# Patient Record
Sex: Male | Born: 1935 | Race: White | Hispanic: No | Smoking: Former smoker
Health system: Southern US, Community
[De-identification: ages and names within clinical notes are randomized; demographics above are authoritative.]

## PROBLEM LIST (undated history)

## (undated) DIAGNOSIS — E785 Hyperlipidemia, unspecified: Secondary | ICD-10-CM

## (undated) DIAGNOSIS — A809 Acute poliomyelitis, unspecified: Secondary | ICD-10-CM

## (undated) DIAGNOSIS — I4891 Unspecified atrial fibrillation: Secondary | ICD-10-CM

## (undated) DIAGNOSIS — I2699 Other pulmonary embolism without acute cor pulmonale: Secondary | ICD-10-CM

## (undated) DIAGNOSIS — E119 Type 2 diabetes mellitus without complications: Secondary | ICD-10-CM

## (undated) DIAGNOSIS — I10 Essential (primary) hypertension: Secondary | ICD-10-CM

## (undated) DIAGNOSIS — C61 Malignant neoplasm of prostate: Secondary | ICD-10-CM

## (undated) DIAGNOSIS — I5030 Unspecified diastolic (congestive) heart failure: Secondary | ICD-10-CM

## (undated) HISTORY — PX: HIP FRACTURE SURGERY: SHX118

---

## 2004-12-12 DIAGNOSIS — I2699 Other pulmonary embolism without acute cor pulmonale: Secondary | ICD-10-CM

## 2004-12-12 HISTORY — DX: Other pulmonary embolism without acute cor pulmonale: I26.99

## 2005-02-17 ENCOUNTER — Ambulatory Visit (HOSPITAL_COMMUNITY): Admission: RE | Admit: 2005-02-17 | Discharge: 2005-02-17 | Payer: Self-pay | Admitting: Urology

## 2005-03-15 ENCOUNTER — Ambulatory Visit: Payer: Self-pay | Admitting: Internal Medicine

## 2005-09-20 ENCOUNTER — Ambulatory Visit: Admission: RE | Admit: 2005-09-20 | Discharge: 2005-12-02 | Payer: Self-pay | Admitting: *Deleted

## 2013-08-09 ENCOUNTER — Encounter: Payer: Self-pay | Admitting: Cardiology

## 2013-08-11 DIAGNOSIS — I509 Heart failure, unspecified: Secondary | ICD-10-CM

## 2013-08-13 ENCOUNTER — Encounter: Payer: Self-pay | Admitting: Cardiology

## 2013-08-13 DIAGNOSIS — I509 Heart failure, unspecified: Secondary | ICD-10-CM

## 2013-08-13 DIAGNOSIS — I4891 Unspecified atrial fibrillation: Secondary | ICD-10-CM

## 2013-09-03 ENCOUNTER — Encounter: Payer: Self-pay | Admitting: Cardiology

## 2013-09-03 DIAGNOSIS — R9430 Abnormal result of cardiovascular function study, unspecified: Secondary | ICD-10-CM | POA: Insufficient documentation

## 2013-12-12 ENCOUNTER — Encounter: Payer: Self-pay | Admitting: Cardiology

## 2013-12-13 ENCOUNTER — Encounter (HOSPITAL_COMMUNITY): Payer: Self-pay | Admitting: Pulmonary Disease

## 2013-12-13 ENCOUNTER — Inpatient Hospital Stay (HOSPITAL_COMMUNITY)
Admission: AD | Admit: 2013-12-13 | Discharge: 2013-12-20 | DRG: 871 | Disposition: A | Discharge status: Skilled Nursing Facility | Payer: Medicare Other | Source: Other Acute Inpatient Hospital | Attending: Critical Care Medicine | Admitting: Critical Care Medicine

## 2013-12-13 ENCOUNTER — Inpatient Hospital Stay (HOSPITAL_COMMUNITY): Payer: Medicare Other

## 2013-12-13 DIAGNOSIS — A419 Sepsis, unspecified organism: Principal | ICD-10-CM | POA: Diagnosis present

## 2013-12-13 DIAGNOSIS — E872 Acidosis, unspecified: Secondary | ICD-10-CM | POA: Diagnosis present

## 2013-12-13 DIAGNOSIS — E876 Hypokalemia: Secondary | ICD-10-CM | POA: Diagnosis present

## 2013-12-13 DIAGNOSIS — J151 Pneumonia due to Pseudomonas: Secondary | ICD-10-CM | POA: Diagnosis present

## 2013-12-13 DIAGNOSIS — F0390 Unspecified dementia without behavioral disturbance: Secondary | ICD-10-CM | POA: Diagnosis present

## 2013-12-13 DIAGNOSIS — Z8612 Personal history of poliomyelitis: Secondary | ICD-10-CM

## 2013-12-13 DIAGNOSIS — E1169 Type 2 diabetes mellitus with other specified complication: Secondary | ICD-10-CM | POA: Diagnosis present

## 2013-12-13 DIAGNOSIS — I509 Heart failure, unspecified: Secondary | ICD-10-CM | POA: Diagnosis present

## 2013-12-13 DIAGNOSIS — Z794 Long term (current) use of insulin: Secondary | ICD-10-CM

## 2013-12-13 DIAGNOSIS — R6520 Severe sepsis without septic shock: Secondary | ICD-10-CM

## 2013-12-13 DIAGNOSIS — D649 Anemia, unspecified: Secondary | ICD-10-CM | POA: Diagnosis present

## 2013-12-13 DIAGNOSIS — J9600 Acute respiratory failure, unspecified whether with hypoxia or hypercapnia: Secondary | ICD-10-CM | POA: Diagnosis present

## 2013-12-13 DIAGNOSIS — J90 Pleural effusion, not elsewhere classified: Secondary | ICD-10-CM | POA: Diagnosis present

## 2013-12-13 DIAGNOSIS — E8779 Other fluid overload: Secondary | ICD-10-CM | POA: Diagnosis present

## 2013-12-13 DIAGNOSIS — Z8546 Personal history of malignant neoplasm of prostate: Secondary | ICD-10-CM

## 2013-12-13 DIAGNOSIS — E875 Hyperkalemia: Secondary | ICD-10-CM | POA: Diagnosis present

## 2013-12-13 DIAGNOSIS — E43 Unspecified severe protein-calorie malnutrition: Secondary | ICD-10-CM | POA: Diagnosis present

## 2013-12-13 DIAGNOSIS — R6521 Severe sepsis with septic shock: Secondary | ICD-10-CM

## 2013-12-13 DIAGNOSIS — J189 Pneumonia, unspecified organism: Secondary | ICD-10-CM

## 2013-12-13 DIAGNOSIS — I10 Essential (primary) hypertension: Secondary | ICD-10-CM | POA: Diagnosis present

## 2013-12-13 DIAGNOSIS — I4891 Unspecified atrial fibrillation: Secondary | ICD-10-CM | POA: Diagnosis present

## 2013-12-13 DIAGNOSIS — Z79899 Other long term (current) drug therapy: Secondary | ICD-10-CM

## 2013-12-13 DIAGNOSIS — E870 Hyperosmolality and hypernatremia: Secondary | ICD-10-CM | POA: Diagnosis present

## 2013-12-13 DIAGNOSIS — G9340 Encephalopathy, unspecified: Secondary | ICD-10-CM | POA: Diagnosis present

## 2013-12-13 DIAGNOSIS — N179 Acute kidney failure, unspecified: Secondary | ICD-10-CM | POA: Diagnosis present

## 2013-12-13 DIAGNOSIS — R791 Abnormal coagulation profile: Secondary | ICD-10-CM | POA: Diagnosis present

## 2013-12-13 DIAGNOSIS — E785 Hyperlipidemia, unspecified: Secondary | ICD-10-CM | POA: Diagnosis present

## 2013-12-13 DIAGNOSIS — Z7901 Long term (current) use of anticoagulants: Secondary | ICD-10-CM

## 2013-12-13 DIAGNOSIS — R410 Disorientation, unspecified: Secondary | ICD-10-CM | POA: Diagnosis not present

## 2013-12-13 DIAGNOSIS — Z86711 Personal history of pulmonary embolism: Secondary | ICD-10-CM

## 2013-12-13 DIAGNOSIS — R9430 Abnormal result of cardiovascular function study, unspecified: Secondary | ICD-10-CM

## 2013-12-13 DIAGNOSIS — I5032 Chronic diastolic (congestive) heart failure: Secondary | ICD-10-CM | POA: Diagnosis present

## 2013-12-13 DIAGNOSIS — Z87891 Personal history of nicotine dependence: Secondary | ICD-10-CM

## 2013-12-13 HISTORY — DX: Malignant neoplasm of prostate: C61

## 2013-12-13 HISTORY — DX: Unspecified diastolic (congestive) heart failure: I50.30

## 2013-12-13 HISTORY — DX: Hyperlipidemia, unspecified: E78.5

## 2013-12-13 HISTORY — DX: Type 2 diabetes mellitus without complications: E11.9

## 2013-12-13 HISTORY — DX: Other pulmonary embolism without acute cor pulmonale: I26.99

## 2013-12-13 HISTORY — DX: Acute poliomyelitis, unspecified: A80.9

## 2013-12-13 HISTORY — DX: Unspecified atrial fibrillation: I48.91

## 2013-12-13 HISTORY — DX: Essential (primary) hypertension: I10

## 2013-12-13 LAB — TROPONIN I
Troponin I: <0.3 ng/mL (ref <0.30)
Troponin I: <0.3 ng/mL (ref <0.30)

## 2013-12-13 LAB — CBC WITH DIFFERENTIAL/PLATELET
Basophils Absolute: 0 10*3/uL (ref 0.0–0.1)
Basophils Relative: 0 % (ref 0–1)
EOS PCT: 0 % (ref 0–5)
Eosinophils Absolute: 0 10*3/uL (ref 0.0–0.7)
HEMATOCRIT: 36.3 % — AB (ref 39.0–52.0)
Hemoglobin: 11.1 g/dL — ABNORMAL LOW (ref 13.0–17.0)
LYMPHS ABS: 0.8 10*3/uL (ref 0.7–4.0)
Lymphocytes Relative: 3 % — ABNORMAL LOW (ref 12–46)
MCH: 26.7 pg (ref 26.0–34.0)
MCHC: 30.6 g/dL (ref 30.0–36.0)
MCV: 87.5 fL (ref 78.0–100.0)
MONO ABS: 1.8 10*3/uL — AB (ref 0.1–1.0)
Monocytes Relative: 7 % (ref 3–12)
NEUTROS ABS: 23.4 10*3/uL — AB (ref 1.7–7.7)
Neutrophils Relative %: 90 % — ABNORMAL HIGH (ref 43–77)
PLATELETS: 548 10*3/uL — AB (ref 150–400)
RBC: 4.15 MIL/uL — ABNORMAL LOW (ref 4.22–5.81)
RDW: 22 % — ABNORMAL HIGH (ref 11.5–15.5)
WBC: 26 10*3/uL — ABNORMAL HIGH (ref 4.0–10.5)

## 2013-12-13 LAB — COMPREHENSIVE METABOLIC PANEL
ALT: 11 U/L (ref 0–53)
AST: 24 U/L (ref 0–37)
Albumin: 1.7 g/dL — ABNORMAL LOW (ref 3.5–5.2)
Alkaline Phosphatase: 154 U/L — ABNORMAL HIGH (ref 39–117)
BUN: 32 mg/dL — ABNORMAL HIGH (ref 6–23)
CALCIUM: 6.6 mg/dL — AB (ref 8.4–10.5)
CO2: 15 mEq/L — ABNORMAL LOW (ref 19–32)
Chloride: 117 mEq/L — ABNORMAL HIGH (ref 96–112)
Creatinine, Ser: 2.18 mg/dL — ABNORMAL HIGH (ref 0.50–1.35)
GFR calc non Af Amer: 27 mL/min — ABNORMAL LOW (ref >90)
GFR, EST AFRICAN AMERICAN: 31 mL/min — AB (ref >90)
Glucose, Bld: 87 mg/dL (ref 70–99)
Potassium: 3.3 mEq/L — ABNORMAL LOW (ref 3.7–5.3)
SODIUM: 150 meq/L — AB (ref 137–147)
Total Bilirubin: 0.4 mg/dL (ref 0.3–1.2)
Total Protein: 5.7 g/dL — ABNORMAL LOW (ref 6.0–8.3)

## 2013-12-13 LAB — APTT: aPTT: 50 seconds — ABNORMAL HIGH (ref 24–37)

## 2013-12-13 LAB — CORTISOL: Cortisol, Plasma: 24.3 ug/dL

## 2013-12-13 LAB — MRSA PCR SCREENING: MRSA by PCR: POSITIVE — AB

## 2013-12-13 LAB — PROTIME-INR
INR: 3.24 — ABNORMAL HIGH (ref 0.00–1.49)
Prothrombin Time: 31.9 seconds — ABNORMAL HIGH (ref 11.6–15.2)

## 2013-12-13 LAB — PHOSPHORUS: Phosphorus: 4.5 mg/dL (ref 2.3–4.6)

## 2013-12-13 LAB — GLUCOSE, CAPILLARY
GLUCOSE-CAPILLARY: 96 mg/dL (ref 70–99)
GLUCOSE-CAPILLARY: 70 mg/dL (ref 70–99)
Glucose-Capillary: 103 mg/dL — ABNORMAL HIGH (ref 70–99)
Glucose-Capillary: 90 mg/dL (ref 70–99)

## 2013-12-13 LAB — STREP PNEUMONIAE URINARY ANTIGEN: STREP PNEUMO URINARY ANTIGEN: NEGATIVE

## 2013-12-13 LAB — PROCALCITONIN: PROCALCITONIN: 0.39 ng/mL

## 2013-12-13 LAB — INFLUENZA PANEL BY PCR (TYPE A & B)
H1N1 flu by pcr: NOT DETECTED
Influenza A By PCR: NEGATIVE
Influenza B By PCR: NEGATIVE

## 2013-12-13 LAB — LACTIC ACID, PLASMA: LACTIC ACID, VENOUS: 2.2 mmol/L (ref 0.5–2.2)

## 2013-12-13 LAB — PRO B NATRIURETIC PEPTIDE: Pro B Natriuretic peptide (BNP): 7829 pg/mL — ABNORMAL HIGH (ref 0–450)

## 2013-12-13 LAB — MAGNESIUM: Magnesium: 1.9 mg/dL (ref 1.5–2.5)

## 2013-12-13 MED ORDER — SODIUM CHLORIDE 0.9 % IV SOLN
250.0000 mL | INTRAVENOUS | Status: DC | PRN
  Administered 2013-12-14: via INTRAVENOUS

## 2013-12-13 MED ORDER — BIOTENE DRY MOUTH MT LIQD
15.0000 mL | Freq: Four times a day (QID) | OROMUCOSAL | Status: DC
  Administered 2013-12-13 – 2013-12-14 (×5): 15 mL via OROMUCOSAL

## 2013-12-13 MED ORDER — LEVOFLOXACIN IN D5W 750 MG/150ML IV SOLN
750.0000 mg | Freq: Once | INTRAVENOUS | Status: AC
  Administered 2013-12-13: 750 mg via INTRAVENOUS
  Filled 2013-12-13: qty 150

## 2013-12-13 MED ORDER — VANCOMYCIN HCL 10 G IV SOLR
1250.0000 mg | Freq: Once | INTRAVENOUS | Status: AC
  Administered 2013-12-13: 1250 mg via INTRAVENOUS
  Filled 2013-12-13: qty 1250

## 2013-12-13 MED ORDER — METOPROLOL TARTRATE 1 MG/ML IV SOLN
5.0000 mg | Freq: Once | INTRAVENOUS | Status: AC
  Administered 2013-12-13: 5 mg via INTRAVENOUS

## 2013-12-13 MED ORDER — CHLORHEXIDINE GLUCONATE 0.12 % MT SOLN
15.0000 mL | Freq: Two times a day (BID) | OROMUCOSAL | Status: DC
  Administered 2013-12-13 – 2013-12-14 (×2): 15 mL via OROMUCOSAL
  Filled 2013-12-13 (×2): qty 15

## 2013-12-13 MED ORDER — LEVOFLOXACIN IN D5W 500 MG/100ML IV SOLN
500.0000 mg | INTRAVENOUS | Status: DC
  Administered 2013-12-15: 500 mg via INTRAVENOUS
  Filled 2013-12-13: qty 100

## 2013-12-13 MED ORDER — SODIUM CHLORIDE 0.9 % IJ SOLN: 3.0000 mL | INTRAMUSCULAR | Status: DC | PRN

## 2013-12-13 MED ORDER — SODIUM CHLORIDE 0.9 % IJ SOLN
3.0000 mL | Freq: Two times a day (BID) | INTRAMUSCULAR | Status: DC
  Administered 2013-12-13 – 2013-12-14 (×2): 3 mL via INTRAVENOUS

## 2013-12-13 MED ORDER — OSELTAMIVIR PHOSPHATE 6 MG/ML PO SUSR
30.0000 mg | Freq: Every day | ORAL | Status: DC
  Administered 2013-12-13: 30 mg
  Filled 2013-12-13 (×2): qty 5

## 2013-12-13 MED ORDER — PANTOPRAZOLE SODIUM 40 MG IV SOLR
40.0000 mg | Freq: Every day | INTRAVENOUS | Status: DC
  Administered 2013-12-13 – 2013-12-15 (×3): 40 mg via INTRAVENOUS
  Filled 2013-12-13 (×4): qty 40

## 2013-12-13 MED ORDER — INSULIN ASPART 100 UNIT/ML ~~LOC~~ SOLN
0.0000 [IU] | SUBCUTANEOUS | Status: DC
  Administered 2013-12-14: 3 [IU] via SUBCUTANEOUS
  Administered 2013-12-15: 1 [IU] via SUBCUTANEOUS

## 2013-12-13 MED ORDER — DEXTROSE 5 % IV SOLN
500.0000 mg | Freq: Three times a day (TID) | INTRAVENOUS | Status: DC
  Administered 2013-12-13 – 2013-12-16 (×10): 500 mg via INTRAVENOUS
  Filled 2013-12-13 (×12): qty 0.5

## 2013-12-13 MED ORDER — FENTANYL CITRATE 0.05 MG/ML IJ SOLN
50.0000 ug | INTRAMUSCULAR | Status: DC | PRN
  Administered 2013-12-13 – 2013-12-14 (×2): 50 ug via INTRAVENOUS
  Filled 2013-12-13 (×2): qty 2

## 2013-12-13 MED ORDER — SODIUM CHLORIDE 0.9 % IV BOLUS (SEPSIS)
250.0000 mL | Freq: Once | INTRAVENOUS | Status: AC
  Administered 2013-12-13: 250 mL via INTRAVENOUS

## 2013-12-13 MED ORDER — FENTANYL CITRATE 0.05 MG/ML IJ SOLN
INTRAMUSCULAR | Status: AC
  Filled 2013-12-13: qty 2

## 2013-12-13 MED ORDER — SODIUM CHLORIDE 0.9 % IV SOLN: 250.0000 mL | INTRAVENOUS | Status: DC | PRN

## 2013-12-13 MED ORDER — VITAL AF 1.2 CAL PO LIQD
1000.0000 mL | ORAL | Status: DC
  Administered 2013-12-13: 1000 mL
  Filled 2013-12-13 (×5): qty 1000

## 2013-12-13 MED ORDER — OSELTAMIVIR PHOSPHATE 6 MG/ML PO SUSR
75.0000 mg | Freq: Two times a day (BID) | ORAL | Status: DC
  Filled 2013-12-13 (×2): qty 12.5

## 2013-12-13 MED ORDER — METOPROLOL TARTRATE 1 MG/ML IV SOLN
INTRAVENOUS | Status: AC
  Filled 2013-12-13: qty 5

## 2013-12-13 MED ORDER — METOPROLOL TARTRATE 1 MG/ML IV SOLN: 5.0000 mg | INTRAVENOUS | Status: DC | PRN

## 2013-12-13 MED ORDER — METOPROLOL TARTRATE 1 MG/ML IV SOLN
2.5000 mg | INTRAVENOUS | Status: DC | PRN
  Administered 2013-12-15: 2.5 mg via INTRAVENOUS
  Filled 2013-12-13: qty 5

## 2013-12-13 MED ORDER — VANCOMYCIN HCL IN DEXTROSE 1-5 GM/200ML-% IV SOLN
1000.0000 mg | INTRAVENOUS | Status: DC
  Filled 2013-12-13: qty 200

## 2013-12-13 MED ORDER — HEPARIN SODIUM (PORCINE) 5000 UNIT/ML IJ SOLN
5000.0000 [IU] | Freq: Three times a day (TID) | INTRAMUSCULAR | Status: DC
  Filled 2013-12-13 (×4): qty 1

## 2013-12-13 MED ORDER — INSULIN ASPART 100 UNIT/ML ~~LOC~~ SOLN: 2.0000 [IU] | SUBCUTANEOUS | Status: DC

## 2013-12-13 MED ORDER — SODIUM CHLORIDE 0.9 % IV BOLUS (SEPSIS)
500.0000 mL | Freq: Once | INTRAVENOUS | Status: AC
  Administered 2013-12-13: 500 mL via INTRAVENOUS

## 2013-12-13 NOTE — Progress Notes (Signed)
Clinical Social Work Department BRIEF PSYCHOSOCIAL ASSESSMENT 12/13/2013  Patient:  Francis Dodson, Francis Dodson     Account Number:  [id number]     Admit date:  12/13/2013  Clinical Social Worker:  Freeman Caldron  Date/Time:  12/13/2013 11:21 AM  Referred by:  Physician  Date Referred:  12/13/2013 Referred for  SNF Placement   Other Referral:   Interview type:  Other - See comment Other interview type:   RN informed CSW pt is from [facility]. Facility confirmed.    PSYCHOSOCIAL DATA Living Status:  FACILITY Admitted from facility:  [facility] Level of care:  [place] Primary support name:  Lequan Dobratz ([phone]) Primary support relationship to patient:  CHILD, ADULT Degree of support available:   Good--pt has lived at [facility] since August 14, 2013 and can return if medically stable to do so.    CURRENT CONCERNS Current Concerns  Post-Acute Placement   Other Concerns:    SOCIAL WORK ASSESSMENT / PLAN Pt intubated and CSW unable to reach pt's son. Left voicemail explaining that according to pt chart he is from [facility] and [place] has verified this with the facility and pt can return. CSW invited son to call back if he would not prefer pt return to this facility. Pt currently on ventilator and might need vent/trach SNF if unable to wean. CSW continues to follow case.   Assessment/plan status:  Psychosocial Support/Ongoing Assessment of Needs Other assessment/ plan:   Information/referral to community resources:   SNF (from [facility], might need vent SNF/trach SNF depending on level of care needs.    PATIENT'S/FAMILY'S RESPONSE TO PLAN OF CARE: N/A       Ky Barban, MSW, [facility] Clinical Social Worker [phone]

## 2013-12-13 NOTE — H&P (Signed)
Name: Francis Dodson MRN: [id number] DOB: 1930/11/13    ADMISSION DATE:  12/13/2013  REFERRING MD :  Lovie Macadamia  CHIEF COMPLAINT:  Short of breath  BRIEF PATIENT DESCRIPTION:  80 yo male from SNF went to [facility] ER with difficulty breathing, and found to have PNA.  Intubated, and transferred to [facility] for further treatment.  SIGNIFICANT EVENTS: 1/02 Transfer to [facility] from [facility] / TUBES: ETT 1/02 >> Lt IJ CVL 1/02 >>  CULTURES: Blood 1/02 ([facility]) >> Sputum 1/02 >> Influenza PCR 1/02 >> Pneumococcal Ag 1/02 >> Legionella Ag 1/02 >>  ANTIBIOTICS: Vancomycin 1/02 >> Aztreonam 1/02 >> Levaquin 1/02 >> Tamiflu 1/02 >>  HISTORY OF PRESENT ILLNESS:   Hx from review of medical record.  80 yo male from SNF went to [facility] ER with difficulty breathing, and found to have PNA.  Intubated, and transferred to [facility] for further treatment.  Noted to have decreased appetite, loose stools, and fatigue in SNF.  PAST MEDICAL HISTORY :  Past Medical History  Diagnosis Date  . Atrial fibrillation   . Diabetes mellitus   . Pulmonary embolism   . Hypertension   . Prostate cancer   . 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 Hyperlipidemia   . Diastolic heart failure    Past Surgical History  Procedure Laterality Date  . Hip fracture surgery     Prior to Admission medications   Not on File   Allergies  Allergen Reactions  . Aspirin   . Nabumetone   . Penicillins   . Piroxicam   . Salicylates     FAMILY HISTORY:  Family History  Problem Relation Age of Onset  . CAD Mother   . Pancreatic cancer Brother   . CAD Sister    SOCIAL HISTORY:  has no tobacco, alcohol, and drug history on file.  REVIEW OF SYSTEMS:   Unable to obtain  SUBJECTIVE:   VITAL SIGNS: Temp:  [96.4 F (35.8 C)] 96.4 F (35.8 C) (01/02 0640) Pulse Rate:  [133] 133 (01/02 0640) BP: (152)/(94) 152/94 mmHg (01/02 0640) SpO2:  [100 %] 100 % (01/02 0640) FiO2 (%):  [60 %] 60  % (01/02 0640) Weight:  [130 lb 8.2 oz (59.2 kg)] 130 lb 8.2 oz (59.2 kg) (01/02 0640) HEMODYNAMICS:   VENTILATOR SETTINGS: Vent Mode:  [-] PRVC FiO2 (%):  [60 %] 60 % Set Rate:  [16 bmp] 16 bmp Vt Set:  [500 mL] 500 mL PEEP:  [5 cmH20] 5 cmH20 Plateau Pressure:  [15 cmH20] 15 cmH20 INTAKE / OUTPUT: Intake/Output   None     PHYSICAL EXAMINATION: General:  Ill appearing Neuro:  Unresponsive HEENT:  Pupils reactive, ETT in place Cardiovascular:  irregular Lungs:  Scattered rhonchi, basilar rales Lt > Rt  Abdomen:  Soft, non tender, decreased bowel sounds Musculoskeletal:  No edema Skin:  No rashes  LABS:  CBC No results found for this basename: WBC, HGB, HCT, PLT,  in the last 168 hours Coag's No results found for this basename: APTT, INR,  in the last 168 hours BMET No results found for this basename: NA, K, CL, CO2, BUN, CREATININE, GLUCOSE,  in the last 168 hours Electrolytes No results found for this basename: CALCIUM, MG, PHOS,  in the last 168 hours Sepsis Markers No results found for this basename: LATICACIDVEN, PROCALCITON, O2SATVEN,  in the last 168 hours ABG No results found for this basename: PHART, PCO2ART, PO2ART,  in the last 168 hours Liver Enzymes No results found for this basename: AST, ALT, ALKPHOS, BILITOT, ALBUMIN,  in the last 168 hours Cardiac Enzymes No results found for this basename: TROPONINI, PROBNP,  in the last 168 hours Glucose No results found for this basename: GLUCAP,  in the last 168 hours  Imaging No results found.   Labs, xrays reviewed from [place].  ASSESSMENT / PLAN:  PULMONARY A: Acute respiratory failure 2nd to PNA. P:   -full vent support -f/u CXR, ABG  CARDIOVASCULAR A:  Hypotension, Sepsis. Hx of A fib, reucrrent PE on chronic coumadin. Hx of HTN, hyperlipidemia, chronic diastolic dysfx. P:  -Continue IV fluids -check CVP -add pressors as needed to keep SBP > 90, MAP > 65  RENAL A:   Acute kidney  injury. Hyperkalemia. Acidosis. Hx of prostate cancer. P:   -f/u BMET, ABG -monitor renal fx, urine outpt  GASTROINTESTINAL A:   Severe protein calorie malnutrition. P:   -NPO -protonix for SUP -tube feeds while on vent  HEMATOLOGIC A:   Anemia. Coagulopathy 2nd to chronic coumadin therapy. P:  -f/u CBC -pharmacy to dose coumadin based on INR  INFECTIOUS A:   PNA in SNF resident.  Allergy to PCN. P:   -vancomycin, aztreonam, levaquin, tamiflu  ENDOCRINE A:   Hx of DM type II.   P:   -SSI  NEUROLOGIC A:   Acute encephalopathy. P:   -sedation protocol while on vent  CC time 40 minutes.  Chesley Mires, MD [facility] Pulmonary/Critical Care 12/13/2013, 7:39 AM Pager:  [phone] After 3pm call: [phone]

## 2013-12-13 NOTE — Procedures (Signed)
**Note De-Identified Evelynne Spiers Obfuscation** Extubation Procedure Note  Patient Details:   Name: Francis Dodson DOB: [birth year] MRN: [id number]   Airway Documentation:  Airway 6.5 mm (Active)  Secured at (cm) 20 cm 12/13/2013 12:06 PM  Measured From Lips 12/13/2013 12:06 PM  Secured Location Right 12/13/2013 12:06 PM  Secured By [employer] 12/13/2013 12:06 PM  Site Condition Dry 12/13/2013 12:06 PM    Evaluation  O2 sats: stable throughout Complications: No apparent complications Patient did tolerate procedure well. Bilateral Breath Sounds: Rhonchi Suctioning: Airway Yes + leak, no stridor noted Arriona Prest, Penni Bombard 12/13/2013, 3:37 PM

## 2013-12-13 NOTE — Progress Notes (Signed)
**Note De-Identified  Obfuscation** Rt note:  Sputum collected and sent to lab

## 2013-12-13 NOTE — Progress Notes (Signed)
Name: Francis Dodson MRN: [id number] DOB: Jan 31, 1930    ADMISSION DATE:  12/13/2013  REFERRING MD :  Lovie Macadamia  CHIEF COMPLAINT:  Short of breath  BRIEF PATIENT DESCRIPTION:  80 yo male from SNF went to [facility] ER with difficulty breathing, and found to have PNA.  Intubated, and transferred to [facility] for further treatment.  SIGNIFICANT EVENTS: 1/02 Transfer to [facility] from [place] / TUBES: ETT 1/02 >>1/2 Lt IJ CVL 1/02 >>  CULTURES: Blood 1/02 ([facility]) >> Sputum 1/02 >> Influenza PCR 1/02 >>neg Pneumococcal Ag 1/02 >>neg Legionella Ag 1/02 >>  ANTIBIOTICS: Vancomycin 1/02 >> Aztreonam 1/02 >> Levaquin 1/02 >> Tamiflu 1/02 >>  PAST MEDICAL HISTORY :  Past Medical History  Diagnosis Date  . Atrial fibrillation   . Diabetes mellitus   . Pulmonary embolism   . Hypertension   . Prostate cancer   . Polio Age 78    Left side weakness  . Hyperlipidemia   . Diastolic heart failure     SUBJECTIVE: awake & agiatted  VITAL SIGNS: Temp:  [96.4 F (35.8 C)-97.6 F (36.4 C)] 97.6 F (36.4 C) (01/02 1548) Pulse Rate:  [27-133] 84 (01/02 1600) Resp:  [16-36] 17 (01/02 1700) BP: (76-152)/(37-110) 88/44 mmHg (01/02 1700) SpO2:  [72 %-100 %] 100 % (01/02 1700) FiO2 (%):  [40 %-60 %] 40 % (01/02 1500) Weight:  [59.2 kg (130 lb 8.2 oz)] 59.2 kg (130 lb 8.2 oz) (01/02 0640) HEMODYNAMICS: CVP:  [4 mmHg-7 mmHg] 4 mmHg VENTILATOR SETTINGS: Vent Mode:  [-] PSV;CPAP FiO2 (%):  [40 %-60 %] 40 % Set Rate:  [16 bmp] 16 bmp Vt Set:  [500 mL] 500 mL PEEP:  [5 cmH20] 5 cmH20 Pressure Support:  [5 cmH20] 5 cmH20 Plateau Pressure:  [15 cmH20] 15 cmH20 INTAKE / OUTPUT: Intake/Output     01/01 0701 - 01/02 0700 01/02 0701 - 01/03 0700   I.V. (mL/kg)  675 (11.4)   NG/GT  24.3   IV Piggyback  1000   Total Intake(mL/kg)  1699.3 (28.7)   Urine (mL/kg/hr)  175 (0.3)   Total Output   175   Net   +1524.3          PHYSICAL EXAMINATION: General:  Ill  appearing Neuro:  Unresponsive HEENT:  Pupils reactive, ETT in place Cardiovascular:  irregular Lungs:  Scattered rhonchi, basilar rales Lt > Rt  Abdomen:  Soft, non tender, decreased bowel sounds Musculoskeletal:  No edema Skin:  No rashes  LABS:  CBC  Recent Labs Lab 12/13/13 0820  WBC 26.0*  HGB 11.1*  HCT 36.3*  PLT 548*   Coag's  Recent Labs Lab 12/13/13 0820  APTT 50*  INR 3.24*   BMET  Recent Labs Lab 12/13/13 0820  NA 150*  K 3.3*  CL 117*  CO2 15*  BUN 32*  CREATININE 2.18*  GLUCOSE 87   Electrolytes  Recent Labs Lab 12/13/13 0820  CALCIUM 6.6*  MG 1.9  PHOS 4.5   Sepsis Markers  Recent Labs Lab 12/13/13 0820  LATICACIDVEN 2.2  PROCALCITON 0.39   ABG No results found for this basename: PHART, PCO2ART, PO2ART,  in the last 168 hours Liver Enzymes  Recent Labs Lab 12/13/13 0820  AST 24  ALT 11  ALKPHOS 154*  BILITOT 0.4  ALBUMIN 1.7*   Cardiac Enzymes  Recent Labs Lab 12/13/13 0820 12/13/13 1310  TROPONINI <0.30 <0.30  PROBNP 7829.0*  --    Glucose  Recent Labs Lab 12/13/13 0758 12/13/13 1205 12/13/13 1547  GLUCAP 96 103* 90    Imaging Dg Chest Port 1 View  12/13/2013   CLINICAL DATA:  Assess central line placement.  EXAM: PORTABLE CHEST - 1 VIEW  COMPARISON:  12/13/2013 at 3:23 a.m.  FINDINGS: Endotracheal tube has tip 4.7 cm above the carinal. There has been placement of a left IJ central venous catheterization with tip over the SVC.  The lungs are adequately inflated with mild worsening of a bibasilar process right worse than left likely due to infection with associated small effusions/atelectasis. There is no evidence of pneumothorax. Cardiomediastinal silhouette and remainder of the exam is unchanged.  IMPRESSION: Worsening bibasilar process suggesting infection likely with associated small effusions/atelectasis.  Tubes and lines as described.   Electronically Signed   By: Marin Olp M.D.   On: 12/13/2013 07:57    Dg Abd Portable 1v  12/13/2013   CLINICAL DATA:  OG tube placement.  EXAM: PORTABLE ABDOMEN - 1 VIEW  COMPARISON:  None.  FINDINGS: NG tube tip is in the proximal to mid stomach. The side port is near the GE junction.  Nonobstructive bowel gas pattern. No supine evidence of free air. Surgical clips in the upper abdomen near the GE junction.  IMPRESSION: NG tube tip in the proximal to mid stomach.   Electronically Signed   By: Rolm Baptise M.D.   On: 12/13/2013 10:37     Labs, xrays reviewed from [place].  ASSESSMENT / PLAN:  PULMONARY A: Acute respiratory failure 2nd to PNA. P:   -tolerating SBts now that awake -extubate  CARDIOVASCULAR A:  Hypotension, Sepsis. Hx of A fib, reucrrent PE on chronic coumadin. Hx of HTN, hyperlipidemia, chronic diastolic dysfx. P:  -CVp low, Continue IV fluids -add pressors as needed to keep SBP > 90, MAP > 65  RENAL A:   Acute kidney injury. Hyperkalemia. Acidosis. Hx of prostate cancer. P:   -f/u BMET, ABG -monitor renal fx, urine outpt  GASTROINTESTINAL A:   Severe protein calorie malnutrition. P:   -NPO -protonix for SUP -advance PO if OK -tube feeds while on vent  HEMATOLOGIC A:   Anemia. Coagulopathy 2nd to chronic coumadin therapy. P:  -f/u CBC -pharmacy to dose coumadin based on INR  INFECTIOUS A:   PNA in SNF resident.  Allergy to PCN. P:   -vancomycin, aztreonam, levaquin, tamiflu  ENDOCRINE A:   Hx of DM type II.   P:   -SSI  NEUROLOGIC A:   Acute encephalopathy. P:   -dc sedation protocol   The patient is critically ill with multiple organ systems failure and requires high complexity decision making for assessment and support, frequent evaluation and titration of therapies, application of advanced monitoring technologies and extensive interpretation of multiple databases. Critical Care Time devoted to patient care services described in this note is 35 minutes.    Kara Mead MD. [place]. [place]  Pulmonary & Critical care Pager [phone] If no response call [phone]    12/13/2013, 6:15 PM

## 2013-12-13 NOTE — Progress Notes (Signed)
eLink Physician-Brief Progress Note Patient Name: Francis Dodson DOB: [birth year] MRN: [id number]  Date of Service  12/13/2013   HPI/Events of Note   Some relative hypotension, tachycardia in A fib to 120-130's  eICU Interventions  Gentle IVF bolus and follow   Intervention Category Major Interventions: Hypotension - evaluation and management  Rusty Villella S. 12/13/2013, 3:55 PM

## 2013-12-13 NOTE — Progress Notes (Signed)
eLink Physician-Brief Progress Note Patient Name: Francis Dodson DOB: Oct 01, 1930 MRN: [id number]  Date of Service  12/13/2013   HPI/Events of Note  Pt tfr from [place] with Afib , [facility] HCAP, SNF pt.  Poor iv access.    eICU Interventions  See orders , full H and P to  follow   Intervention Category Major Interventions: Respiratory failure - evaluation and management  Asencion Noble 12/13/2013, 6:44 AM

## 2013-12-13 NOTE — Progress Notes (Signed)
CSW received return call from pt's son Edd Arbour, and CSW explained role in discharge back to [facility]. Pt's son thanked CSW for help with arranging for discharge when pt is medically ready.   Ky Barban, MSW, [facility] Clinical Social Worker [phone]

## 2013-12-13 NOTE — Progress Notes (Signed)
INITIAL NUTRITION ASSESSMENT  DOCUMENTATION CODES Per approved criteria  -Not Applicable   INTERVENTION: Continue to advance Vital AF 1.2 to goal rate of 40 ml/hr.   Tube feeding regimen provides 1152 kcal, 72 grams protein, and 778 ml H2O.   NUTRITION DIAGNOSIS: Inadequate oral intake related to inability to eat as evidenced by NPO status.  Goal: Pt to meet >/= 90% of their estimated nutrition needs   Monitor:  Vent status, TF initiation and tolerance, weight trend, labs  Reason for Assessment: Consult received to initiate and manage enteral nutrition support.  80 y.o. male  Admitting Dx: <principal problem not specified>  ASSESSMENT: Pt admitted from SNF with difficulty breathing, PNA.  Patient is currently intubated on ventilator support.  MV: 9 L/min Temp (24hrs), Avg:96.4 F (35.8 C), Min:96.4 F (35.8 C), Max:96.4 F (35.8 C)  Nutrition-focused physical exam deferred due to RN and MD in to evaluate pt. Question accuracy of height.   Temp low. Potassium low.   Height: Ht Readings from Last 1 Encounters:  12/13/13 4\' 10"  (1.473 m)    Weight: Wt Readings from Last 1 Encounters:  12/13/13 130 lb 8.2 oz (59.2 kg)    Ideal Body Weight: 43.9 kg   % Ideal Body Weight: 135%  Wt Readings from Last 10 Encounters:  12/13/13 130 lb 8.2 oz (59.2 kg)    Usual Body Weight: unknown  % Usual Body Weight: -  BMI:  Body mass index is 27.28 kg/(m^2).  Estimated Nutritional Needs: Kcal: 1119 Protein: 70-85 grams Fluid: > 1.5 L/day  Skin: no issues noted  Diet Order: NPO  EDUCATION NEEDS: -No education needs identified at this time   Intake/Output Summary (Last 24 hours) at 12/13/13 1343 Last data filed at 12/13/13 1300  Gross per 24 hour  Intake    825 ml  Output    100 ml  Net    725 ml    Last BM: PTA   Labs:   Recent Labs Lab 12/13/13 0820  NA 150*  K 3.3*  CL 117*  CO2 15*  BUN 32*  CREATININE 2.18*  CALCIUM 6.6*  MG 1.9  PHOS  4.5  GLUCOSE 87    CBG (last 3)   Recent Labs  12/13/13 0758 12/13/13 1205  GLUCAP 96 103*    Scheduled Meds: . antiseptic oral rinse  15 mL Mouth Rinse QID  . aztreonam  500 mg Intravenous Q8H  . chlorhexidine  15 mL Mouth Rinse BID  . fentaNYL      . insulin aspart  0-9 Units Subcutaneous Q4H  . [START ON 12/15/2013] levofloxacin (LEVAQUIN) IV  500 mg Intravenous Q48H  . oseltamivir  30 mg Per Tube Daily  . pantoprazole (PROTONIX) IV  40 mg Intravenous QHS  . sodium chloride  3 mL Intravenous Q12H  . [START ON 12/15/2013] vancomycin  1,000 mg Intravenous Q48H    Continuous Infusions: . feeding supplement (VITAL AF 1.2 CAL) 1,000 mL (12/13/13 1326)    Past Medical History  Diagnosis Date  . Atrial fibrillation   . Diabetes mellitus   . Pulmonary embolism   . Hypertension   . Prostate cancer   . 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 Hyperlipidemia   . Diastolic heart failure     Past Surgical History  Procedure Laterality Date  . Hip fracture surgery      Maylon Peppers RD, LDN, [place] Pager [phone] After Hours Pager

## 2013-12-13 NOTE — Progress Notes (Signed)
UR completed.  Savannha Welle, RN BSN [facility] CCM Trauma/Neuro ICU Case Manager [phone]  

## 2013-12-13 NOTE — Progress Notes (Addendum)
ANTIBIOTIC CONSULT NOTE - INITIAL  Pharmacy Consult for Vancomycin/Aztreonam/Levaquin  Indication: pneumonia  Allergies  Allergen Reactions  . Aspirin   . Nabumetone   . Penicillins   . Piroxicam   . Salicylates     Patient Measurements: Height: 4\' 10"  (147.3 cm) Weight: 130 lb 8.2 oz (59.2 kg) IBW/kg (Calculated) : 45.4  Vital Signs: Temp: 96.4 F (35.8 C) (01/02 0640) Temp src: Rectal (01/02 0640) BP: 152/94 mmHg (01/02 0640) Pulse Rate: 133 (01/02 0640) Intake/Output from previous day:   Intake/Output from this shift:    Labs (at [facility]): WBC  15.3 Hgb  13.7 Hct  44.1 Plt  721   INR 2.8  SCr  2.78 (baseline ~ 0.8) CrCl ~ 20 mL/min  No results found for this basename: WBC, HGB, PLT, LABCREA, CREATININE,  in the last 72 hours CrCl is unknown because no creatinine reading has been taken. No results found for this basename: VANCOTROUGH, VANCOPEAK, VANCORANDOM, GENTTROUGH, GENTPEAK, GENTRANDOM, TOBRATROUGH, TOBRAPEAK, TOBRARND, AMIKACINPEAK, AMIKACINTROU, AMIKACIN,  in the last 72 hours   Microbiology: No results found for this or any previous visit (from the past 720 hour(s)).  Medical History: Htn  h/o PE  H/o Prostate CA  Afib  HLD    Past Medical History  Diagnosis Date  . Atrial fibrillation   . Diabetes mellitus   . Pulmonary embolism   . Hypertension   . Prostate cancer   . Polio Age 89    Left side weakness  . Hyperlipidemia   . Diastolic heart failure     Medications:  KCl  Atenolol  Nexium  Lasix  Remeron  Xanax  Coumadin (dose unknown)  Assessment: 80 yo male with VDRF/PNA for empiric antibiotics  Goal of Therapy:  Vancomycin trough level 15-20 mcg/ml  Plan:  Vancomycin 1250 mg IV now, then 1 g IV q48h Aztreonam 500 mg IV q8h Levaquin 750 mg IV now, then 500 mg IV q48h Change Tamiflu 30 mg daily for CrCl < 30 mL/min  Caryl Pina 12/13/2013,7:39 AM

## 2013-12-13 NOTE — Progress Notes (Signed)
Pt pulled out central line. Dressing applied and MD aware.

## 2013-12-14 ENCOUNTER — Inpatient Hospital Stay (HOSPITAL_COMMUNITY): Payer: Medicare Other

## 2013-12-14 DIAGNOSIS — J189 Pneumonia, unspecified organism: Secondary | ICD-10-CM

## 2013-12-14 DIAGNOSIS — J9600 Acute respiratory failure, unspecified whether with hypoxia or hypercapnia: Secondary | ICD-10-CM

## 2013-12-14 LAB — BASIC METABOLIC PANEL
BUN: 29 mg/dL — ABNORMAL HIGH (ref 6–23)
CO2: 18 mEq/L — ABNORMAL LOW (ref 19–32)
CREATININE: 1.64 mg/dL — AB (ref 0.50–1.35)
Calcium: 7.2 mg/dL — ABNORMAL LOW (ref 8.4–10.5)
Chloride: 119 mEq/L — ABNORMAL HIGH (ref 96–112)
GFR, EST AFRICAN AMERICAN: 44 mL/min — AB (ref >90)
GFR, EST NON AFRICAN AMERICAN: 38 mL/min — AB (ref >90)
Glucose, Bld: 75 mg/dL (ref 70–99)
Potassium: 3 mEq/L — ABNORMAL LOW (ref 3.7–5.3)
Sodium: 151 mEq/L — ABNORMAL HIGH (ref 137–147)

## 2013-12-14 LAB — LEGIONELLA ANTIGEN, URINE: LEGIONELLA ANTIGEN, URINE: NEGATIVE

## 2013-12-14 LAB — CBC
HCT: 35.5 % — ABNORMAL LOW (ref 39.0–52.0)
Hemoglobin: 10.8 g/dL — ABNORMAL LOW (ref 13.0–17.0)
MCH: 26.9 pg (ref 26.0–34.0)
MCHC: 30.4 g/dL (ref 30.0–36.0)
MCV: 88.3 fL (ref 78.0–100.0)
PLATELETS: 387 10*3/uL (ref 150–400)
RBC: 4.02 MIL/uL — ABNORMAL LOW (ref 4.22–5.81)
RDW: 22.3 % — AB (ref 11.5–15.5)
WBC: 16.9 10*3/uL — ABNORMAL HIGH (ref 4.0–10.5)

## 2013-12-14 LAB — GLUCOSE, CAPILLARY
GLUCOSE-CAPILLARY: 104 mg/dL — AB (ref 70–99)
GLUCOSE-CAPILLARY: 61 mg/dL — AB (ref 70–99)
GLUCOSE-CAPILLARY: 85 mg/dL (ref 70–99)
Glucose-Capillary: 126 mg/dL — ABNORMAL HIGH (ref 70–99)
Glucose-Capillary: 70 mg/dL (ref 70–99)
Glucose-Capillary: 75 mg/dL (ref 70–99)
Glucose-Capillary: 79 mg/dL (ref 70–99)

## 2013-12-14 MED ORDER — MUPIROCIN 2 % EX OINT
1.0000 "application " | TOPICAL_OINTMENT | Freq: Two times a day (BID) | CUTANEOUS | Status: AC
  Administered 2013-12-14 – 2013-12-18 (×10): 1 via NASAL
  Filled 2013-12-14 (×2): qty 22

## 2013-12-14 MED ORDER — DEXTROSE 5 % IV SOLN
INTRAVENOUS | Status: DC
  Administered 2013-12-14: 1000 mL via INTRAVENOUS
  Administered 2013-12-14: 12:00:00 via INTRAVENOUS
  Administered 2013-12-15: 1000 mL via INTRAVENOUS
  Administered 2013-12-15: 125 mL via INTRAVENOUS
  Administered 2013-12-16: 1000 mL via INTRAVENOUS

## 2013-12-14 MED ORDER — CHLORHEXIDINE GLUCONATE CLOTH 2 % EX PADS
6.0000 | MEDICATED_PAD | Freq: Every day | CUTANEOUS | Status: AC
  Administered 2013-12-14 – 2013-12-18 (×5): 6 via TOPICAL

## 2013-12-14 MED ORDER — DEXTROSE 50 % IV SOLN
INTRAVENOUS | Status: AC
  Administered 2013-12-14: 25 mL
  Filled 2013-12-14: qty 50

## 2013-12-14 MED ORDER — VANCOMYCIN HCL IN DEXTROSE 750-5 MG/150ML-% IV SOLN
750.0000 mg | INTRAVENOUS | Status: DC
  Administered 2013-12-14 – 2013-12-15 (×2): 750 mg via INTRAVENOUS
  Filled 2013-12-14 (×3): qty 150

## 2013-12-14 MED ORDER — ALBUTEROL SULFATE (2.5 MG/3ML) 0.083% IN NEBU: 2.5000 mg | INHALATION_SOLUTION | RESPIRATORY_TRACT | Status: DC | PRN

## 2013-12-14 MED ORDER — BIOTENE DRY MOUTH MT LIQD
15.0000 mL | Freq: Two times a day (BID) | OROMUCOSAL | Status: DC
  Administered 2013-12-14 – 2013-12-20 (×11): 15 mL via OROMUCOSAL

## 2013-12-14 MED ORDER — POTASSIUM CHLORIDE CRYS ER 20 MEQ PO TBCR
40.0000 meq | EXTENDED_RELEASE_TABLET | Freq: Once | ORAL | Status: AC
  Administered 2013-12-14: 40 meq via ORAL
  Filled 2013-12-14: qty 2

## 2013-12-14 NOTE — Progress Notes (Signed)
ANTIBIOTIC CONSULT NOTE - Follow-up  Pharmacy Consult for Vancomycin  Indication: pneumonia  Allergies  Allergen Reactions  . Aspirin   . Nabumetone   . Penicillins   . Piroxicam   . Salicylates     Patient Measurements: Height: 4\' 10"  (147.3 cm) Weight: 139 lb 12.4 oz (63.4 kg) IBW/kg (Calculated) : 45.4  Vital Signs: Temp: 97.9 F (36.6 C) (01/03 1213) Temp src: Oral (01/03 1213) BP: 94/57 mmHg (01/03 1400) Pulse Rate: 110 (01/03 1200) Intake/Output from previous day: 01/02 0701 - 01/03 0700 In: 2699.3 [I.V.:1575; NG/GT:24.3; IV Piggyback:1100] Out: 625 [Urine:625] Intake/Output from this shift: Total I/O In: 594.2 [I.V.:544.2; IV Piggyback:50] Out: 150 [Urine:150]  Labs (at [facility]): WBC  15.3 Hgb  13.7 Hct  44.1 Plt  721   INR 2.8  SCr  2.78 (baseline ~ 0.8) CrCl ~ 20 mL/min   Recent Labs  12/13/13 0820 12/14/13 0410  WBC 26.0* 16.9*  HGB 11.1* 10.8*  PLT 548* 387  CREATININE 2.18* 1.64*   Estimated Creatinine Clearance: 26.3 ml/min (by C-G formula based on Cr of 1.64). No results found for this basename: VANCOTROUGH, Corlis Leak, VANCORANDOM, GENTTROUGH, GENTPEAK, GENTRANDOM, TOBRATROUGH, TOBRAPEAK, TOBRARND, AMIKACINPEAK, AMIKACINTROU, AMIKACIN,  in the last 72 hours   Microbiology: Recent Results (from the past 720 hour(s))  MRSA PCR SCREENING     Status: Abnormal   Collection Time    12/13/13  6:25 AM      Result Value Range Status   MRSA by PCR POSITIVE (*) NEGATIVE Final   Comment:            The GeneXpert MRSA Assay (FDA     approved for NASAL specimens     only), is one component of a     comprehensive MRSA colonization     surveillance program. It is not     intended to diagnose MRSA     infection nor to guide or     monitor treatment for     MRSA infections.     RESULT CALLED TO, READ BACK BY AND VERIFIED WITH:     C BRUNO,RN 12/13/13 0818 BY K SCHULTZ  CULTURE, RESPIRATORY (NON-EXPECTORATED)     Status: None   Collection Time     12/13/13 12:09 PM      Result Value Range Status   Specimen Description ENDOTRACHEAL   Final   Special Requests NONE   Final   Gram Stain     Final   Value: ABUNDANT WBC PRESENT, PREDOMINANTLY PMN     NO SQUAMOUS EPITHELIAL CELLS SEEN     FEW GRAM NEGATIVE RODS     Performed at [facility]   Culture PENDING   Incomplete   Report Status PENDING   Incomplete   Assessment: 71 yom continues on broad-spectrum antibiotics for possible pneumonia. Renal function is improving. Pt is afebrile and WBC is 16.9.   1/2 aztreonam>> 1/2 LVQ>> 1/2 vanc>> 1/2 Tamiflu >> 1/3  1/2 MRSA PCR - POS 1/2 influ PCR - NEG 1/2 Resp - pending  1/2 bcx (from [facility])>>  Goal of Therapy:  Vancomycin trough level 15-20 mcg/ml  Plan:  1. Change vanc to 750mg  IV Q24H 2. F/u renal fxn, C&S, clinical status and trough at [facility], PharmD, BCPS Pager # [phone] 12/14/2013 2:42 PM

## 2013-12-14 NOTE — Progress Notes (Signed)
Name: Francis Dodson MRN: [id number] DOB: Jul 30, 1930    ADMISSION DATE:  12/13/2013  REFERRING MD :  Lovie Macadamia  CHIEF COMPLAINT:  Short of breath  BRIEF PATIENT DESCRIPTION:  80 yo male from SNF went to [facility] ER with difficulty breathing, and found to have PNA.  Intubated, and transferred to [facility] for further treatment.  SIGNIFICANT EVENTS: 1/02 Transfer to [facility] from [place] / TUBES: ETT 1/02 >>1/2 Lt IJ CVL 1/02 >>  CULTURES: Blood 1/02 ([place]) >> Sputum 1/02 >> Influenza PCR 1/02 >>neg Pneumococcal Ag 1/02 >>neg Legionella Ag 1/02 >>  ANTIBIOTICS: Vancomycin 1/02 >> Aztreonam 1/02 >> Levaquin 1/02 >> Tamiflu 1/02 >> 1/3  SUBJECTIVE:  Extubated successfully 1/2  VITAL SIGNS: Temp:  [97.5 F (36.4 C)-98.3 F (36.8 C)] 98.3 F (36.8 C) (01/03 0800) Pulse Rate:  [46-112] 50 (01/03 0900) Resp:  [13-28] 19 (01/03 1000) BP: (75-118)/(37-86) 96/53 mmHg (01/03 1000) SpO2:  [91 %-100 %] 99 % (01/03 1000) FiO2 (%):  [40 %] 40 % (01/02 1500) Weight:  [63.4 kg (139 lb 12.4 oz)] 63.4 kg (139 lb 12.4 oz) (01/03 0403) HEMODYNAMICS: CVP:  [0 mmHg-7 mmHg] 0 mmHg VENTILATOR SETTINGS: Vent Mode:  [-] PSV;CPAP FiO2 (%):  [40 %] 40 % PEEP:  [5 cmH20] 5 cmH20 Pressure Support:  [5 cmH20] 5 cmH20 INTAKE / OUTPUT: Intake/Output     01/02 0701 - 01/03 0700 01/03 0701 - 01/04 0700   I.V. (mL/kg) 1575 (24.8) 300 (4.7)   NG/GT 24.3    IV Piggyback 1100    Total Intake(mL/kg) 2699.3 (42.6) 300 (4.7)   Urine (mL/kg/hr) 625 (0.4)    Total Output 625     Net +2074.3 +300          PHYSICAL EXAMINATION: General:  Ill appearing Neuro:  Unresponsive HEENT:  Pupils reactive, ETT in place Cardiovascular:  irregular Lungs:  Scattered rhonchi, basilar rales Lt > Rt  Abdomen:  Soft, non tender, decreased bowel sounds Musculoskeletal:  No edema Skin:  No rashes  LABS:  CBC  Recent Labs Lab 12/13/13 0820 12/14/13 0410  WBC 26.0* 16.9*  HGB 11.1*  10.8*  HCT 36.3* 35.5*  PLT 548* 387   Coag's  Recent Labs Lab 12/13/13 0820  APTT 50*  INR 3.24*   BMET  Recent Labs Lab 12/13/13 0820 12/14/13 0410  NA 150* 151*  K 3.3* 3.0*  CL 117* 119*  CO2 15* 18*  BUN 32* 29*  CREATININE 2.18* 1.64*  GLUCOSE 87 75   Electrolytes  Recent Labs Lab 12/13/13 0820 12/14/13 0410  CALCIUM 6.6* 7.2*  MG 1.9  --   PHOS 4.5  --    Sepsis Markers  Recent Labs Lab 12/13/13 0820  LATICACIDVEN 2.2  PROCALCITON 0.39   ABG No results found for this basename: PHART, PCO2ART, PO2ART,  in the last 168 hours Liver Enzymes  Recent Labs Lab 12/13/13 0820  AST 24  ALT 11  ALKPHOS 154*  BILITOT 0.4  ALBUMIN 1.7*   Cardiac Enzymes  Recent Labs Lab 12/13/13 0820 12/13/13 1310 12/13/13 2200  TROPONINI <0.30 <0.30 <0.30  PROBNP 7829.0*  --   --    Glucose  Recent Labs Lab 12/13/13 1205 12/13/13 1547 12/13/13 1954 12/13/13 2349 12/14/13 0344 12/14/13 0758  GLUCAP 103* 90 70 70 79 75    Imaging Dg Chest Port 1 View  12/14/2013   CLINICAL DATA:  Pneumonia.  EXAM: PORTABLE CHEST - 1 VIEW  COMPARISON:  12/13/2013  FINDINGS: The central line and endotracheal tube have been removed. There is improved aeration at the right lung base. Persistent densities at the left lung base suggest pleural fluid and atelectasis/airspace disease. There are old right rib fractures. Heart size is within normal limits and stable. Thoracic aorta is heavily calcified. No evidence for a pneumothorax.  IMPRESSION: Decreasing airspace disease at the right lung base.  Stable left basilar densities.  Removal of support apparatuses.   Electronically Signed   By: Markus Daft M.D.   On: 12/14/2013 08:16   Dg Chest Port 1 View  12/13/2013   CLINICAL DATA:  Assess central line placement.  EXAM: PORTABLE CHEST - 1 VIEW  COMPARISON:  12/13/2013 at 3:23 a.m.  FINDINGS: Endotracheal tube has tip 4.7 cm above the carinal. There has been placement of a left IJ  central venous catheterization with tip over the SVC.  The lungs are adequately inflated with mild worsening of a bibasilar process right worse than left likely due to infection with associated small effusions/atelectasis. There is no evidence of pneumothorax. Cardiomediastinal silhouette and remainder of the exam is unchanged.  IMPRESSION: Worsening bibasilar process suggesting infection likely with associated small effusions/atelectasis.  Tubes and lines as described.   Electronically Signed   By: Marin Olp M.D.   On: 12/13/2013 07:57   Dg Abd Portable 1v  12/13/2013   CLINICAL DATA:  OG tube placement.  EXAM: PORTABLE ABDOMEN - 1 VIEW  COMPARISON:  None.  FINDINGS: NG tube tip is in the proximal to mid stomach. The side port is near the GE junction.  Nonobstructive bowel gas pattern. No supine evidence of free air. Surgical clips in the upper abdomen near the GE junction.  IMPRESSION: NG tube tip in the proximal to mid stomach.   Electronically Signed   By: Rolm Baptise M.D.   On: 12/13/2013 10:37     ASSESSMENT / PLAN:  PULMONARY A: Acute respiratory failure 2nd to PNA. 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 Hx of HTN, hyperlipidemia, chronic diastolic dysfx. P:  -continue IVF boluses if needed, pressors off -restart anticoagulation when he can take PO  RENAL A:   Acute kidney injury. Hyperkalemia. Acidosis. Hx of prostate cancer. Hypernatremia  P:   -change IVF to d5w given hyponatremia -f/u BMET, ABG -monitor renal fx, urine outpt  GASTROINTESTINAL A:   Severe protein calorie malnutrition. P:   -NPO -protonix for SUP -swallow eval  HEMATOLOGIC A:   Anemia. Coagulopathy 2nd to chronic coumadin therapy. P:  -f/u CBC -restart coumadin when he can take PO  INFECTIOUS A:   PNA in SNF resident.  Allergy to PCN. P:   -vancomycin, aztreonam, levaquin; tailor based  on cx data -d/c tamiflu 1/3  ENDOCRINE A:   Hx of DM type II.   P:   -SSI  NEUROLOGIC A:   Acute encephalopathy. P:      Baltazar Apo, MD, PhD 12/14/2013, 11:37 AM [facility] [phone] or if no answer [phone]

## 2013-12-14 NOTE — Evaluation (Signed)
Clinical/Bedside Swallow Evaluation Patient Details  Name: Francis Dodson MRN: [id number] Date of Birth: 10-30-1930  Today's Date: 12/14/2013 Time: 1425-1440 SLP Time Calculation (min): 15 min  Past Medical History:  Past Medical History  Diagnosis Date  . Atrial fibrillation   . Diabetes mellitus   . Pulmonary embolism   . Hypertension   . Prostate cancer   . Polio Age 80    Left side weakness  . Hyperlipidemia   . Diastolic heart failure    Past Surgical History:  Past Surgical History  Procedure Laterality Date  . Hip fracture surgery     HPI:  80 yo male from SNF went to [facility] ER 1/2 with difficulty breathing, and found to have PNA. Intubated, and transferred to [facility] for further treatment. ETT 1/2-1/2. Initial CXR 1/2: The lungs are adequately inflated with mild worsening of a bibasilar process right worse than left likely due to infection with associated small effusions/atelectasis   Assessment / Plan / Recommendation Clinical Impression  Patient presents with a functional appearing oropharyngeal swallow. Slightly sluggish laryngeal elevation noted and delayed oral transit with regular texture solids due to missing dentition but otherwise, patient without overt s/s of aspiration. Spoke with patient's son who reported no h/o swallowing difficulties or PNA prior to admission. Given advanced age, AMS, admission diagnosis (PNA), and brief intubation, will f/u briefly to monitor toleration of diet.     Aspiration Risk  Mild    Diet Recommendation Dysphagia 3 (Mechanical Soft);Thin liquid   Liquid Administration via: Cup;Straw Medication Administration: Whole meds with liquid Supervision: Patient able to self feed;Intermittent supervision to cue for compensatory strategies Compensations: Slow rate;Small sips/bites Postural Changes and/or Swallow Maneuvers: Seated upright 90 degrees    Other  Recommendations Oral Care Recommendations: Oral care BID   Follow Up  Recommendations  None    Frequency and Duration min 2x/week  1 week   Pertinent Vitals/Pain n/a        Swallow Study    General HPI: 80 yo male from SNF went to [facility] ER 1/2 with difficulty breathing, and found to have PNA. Intubated, and transferred to [facility] for further treatment. ETT 1/2-1/2. Initial CXR 1/2: The lungs are adequately inflated with mild worsening of a bibasilar process right worse than left likely due to infection with associated small effusions/atelectasis Type of Study: Bedside swallow evaluation Previous Swallow Assessment: none noted in chart Diet Prior to this Study: NPO Temperature Spikes Noted: No Respiratory Status: Nasal cannula History of Recent Intubation: Yes Length of Intubations (days):  (less than 24 hours) Date extubated: 12/13/13 Behavior/Cognition: Alert;Cooperative;Pleasant mood Oral Cavity - Dentition: Edentulous (has dentures but does not wear) Self-Feeding Abilities: Able to feed self Patient Positioning: Upright in bed Baseline Vocal Quality: Clear Volitional Cough: Strong Volitional Swallow: Able to elicit    Oral/Motor/Sensory Function Overall Oral Motor/Sensory Function: Appears within functional limits for tasks assessed   Ice Chips Ice chips: Not tested   Thin Liquid Thin Liquid: Within functional limits    Nectar Thick Nectar Thick Liquid: Not tested   Honey Thick Honey Thick Liquid: Not tested   Puree Puree: Within functional limits   Solid   GO   Jony Ladnier MA, CCC-SLP [phone]  Solid: Impaired Oral Phase Impairments: Impaired anterior to posterior transit (delayed due to missing dentition)       Ezana Hubbert Meryl 12/14/2013,2:53 PM

## 2013-12-14 NOTE — Progress Notes (Signed)
E-Link notified of poor urine output and 2 large watery stools.

## 2013-12-15 ENCOUNTER — Inpatient Hospital Stay (HOSPITAL_COMMUNITY): Payer: Medicare Other

## 2013-12-15 DIAGNOSIS — J9600 Acute respiratory failure, unspecified whether with hypoxia or hypercapnia: Secondary | ICD-10-CM

## 2013-12-15 DIAGNOSIS — R0989 Other specified symptoms and signs involving the circulatory and respiratory systems: Secondary | ICD-10-CM

## 2013-12-15 LAB — GLUCOSE, CAPILLARY
GLUCOSE-CAPILLARY: 114 mg/dL — AB (ref 70–99)
GLUCOSE-CAPILLARY: 114 mg/dL — AB (ref 70–99)
GLUCOSE-CAPILLARY: 115 mg/dL — AB (ref 70–99)
GLUCOSE-CAPILLARY: 118 mg/dL — AB (ref 70–99)
GLUCOSE-CAPILLARY: 94 mg/dL (ref 70–99)
Glucose-Capillary: 201 mg/dL — ABNORMAL HIGH (ref 70–99)
Glucose-Capillary: 122 mg/dL — ABNORMAL HIGH (ref 70–99)

## 2013-12-15 LAB — PROTIME-INR
INR: 3.03 — AB (ref 0.00–1.49)
PROTHROMBIN TIME: 30.3 s — AB (ref 11.6–15.2)

## 2013-12-15 LAB — CLOSTRIDIUM DIFFICILE BY PCR: CDIFFPCR: NEGATIVE

## 2013-12-15 MED ORDER — PHENYLEPHRINE HCL 10 MG/ML IJ SOLN
30.0000 ug/min | INTRAVENOUS | Status: DC
  Administered 2013-12-15: 30 ug/min via INTRAVENOUS
  Filled 2013-12-15: qty 4

## 2013-12-15 MED ORDER — HALOPERIDOL LACTATE 5 MG/ML IJ SOLN
5.0000 mg | Freq: Once | INTRAMUSCULAR | Status: AC
  Administered 2013-12-15: 5 mg via INTRAVENOUS
  Filled 2013-12-15: qty 1

## 2013-12-15 MED ORDER — AMIODARONE HCL IN DEXTROSE 360-4.14 MG/200ML-% IV SOLN
60.0000 mg/h | INTRAVENOUS | Status: AC
  Administered 2013-12-15 (×2): 60 mg/h via INTRAVENOUS
  Filled 2013-12-15: qty 200

## 2013-12-15 MED ORDER — AMIODARONE LOAD VIA INFUSION
150.0000 mg | Freq: Once | INTRAVENOUS | Status: AC
  Administered 2013-12-15: 150 mg via INTRAVENOUS
  Filled 2013-12-15: qty 83.34

## 2013-12-15 MED ORDER — ACETAMINOPHEN 325 MG PO TABS
650.0000 mg | ORAL_TABLET | Freq: Four times a day (QID) | ORAL | Status: DC | PRN
Start: 2013-12-15 — End: 2013-12-20
  Administered 2013-12-15 – 2013-12-16 (×3): 650 mg via ORAL
  Filled 2013-12-15 (×3): qty 2

## 2013-12-15 MED ORDER — AMIODARONE HCL IN DEXTROSE 360-4.14 MG/200ML-% IV SOLN
30.0000 mg/h | INTRAVENOUS | Status: DC
  Administered 2013-12-15 – 2013-12-17 (×3): 30 mg/h via INTRAVENOUS
  Filled 2013-12-15 (×11): qty 200

## 2013-12-15 NOTE — Progress Notes (Signed)
PT Cancellation Note  Patient Details Name: Francis Dodson MRN: [id number] DOB: 17-Feb-1930   Cancelled Treatment:    Reason Eval/Treat Not Completed: Medical issues which prohibited therapy.  RN reports patient agitated, A-fib with RVR and hypotensive.  Asked that we hold PT today.  Will return tomorrow for PT evaluation.   Despina Pole 12/15/2013, 9:48 AM Carita Pian. Sanjuana Kava, [place] Pager [phone]

## 2013-12-15 NOTE — Progress Notes (Signed)
Name: CHELSEY KIMBERLEY MRN: [id number] DOB: 01/12/1930    ADMISSION DATE:  12/13/2013  REFERRING MD :  Lovie Macadamia  CHIEF COMPLAINT:  Short of breath  BRIEF PATIENT DESCRIPTION:  80 yo male from SNF went to [facility] ER with difficulty breathing, and found to have PNA.  Intubated, and transferred to [facility] for further treatment.  SIGNIFICANT EVENTS: 1/02 Transfer to [facility] from [place] / TUBES: ETT 1/02 >>1/2 Lt IJ CVL 1/02 >>1/3 (pt pulled) R IJ CVC 1/4 >>   CULTURES: Blood 1/02 ([place]) >> Sputum 1/02 >> Influenza PCR 1/02 >>neg Pneumococcal Ag 1/02 >>neg Legionella Ag 1/02 >>  ANTIBIOTICS: Vancomycin 1/02 >> Aztreonam 1/02 >> Levaquin 1/02 >> Tamiflu 1/02 >> 1/3  SUBJECTIVE:  Some agitation o/n >> treated w haldol Back in A Fib > started on amiodarone gtt and then phenylephrine to support BP, remains on both  VITAL SIGNS: Temp:  [96.7 F (35.9 C)-98.3 F (36.8 C)] 96.7 F (35.9 C) (01/04 0800) Pulse Rate:  [34-134] 92 (01/04 0945) Resp:  [14-31] 14 (01/04 0945) BP: (81-135)/(38-84) 115/65 mmHg (01/04 0945) SpO2:  [90 %-100 %] 100 % (01/04 0945) Weight:  [61.9 kg (136 lb 7.4 oz)] 61.9 kg (136 lb 7.4 oz) (01/04 0400) HEMODYNAMICS:   VENTILATOR SETTINGS:   INTAKE / OUTPUT: Intake/Output     01/03 0701 - 01/04 0700 01/04 0701 - 01/05 0700   P.O. 150    I.V. (mL/kg) 2375.1 (38.4) 301.5 (4.9)   NG/GT     IV Piggyback 305 100   Total Intake(mL/kg) 2830.1 (45.7) 401.5 (6.5)   Urine (mL/kg/hr) 325 (0.2) 125 (0.7)   Total Output 325 125   Net +2505.1 +276.5          PHYSICAL EXAMINATION: General:  Ill appearing Neuro:  Awake and interacting. Oriented to self. Follows commands and moves ext HEENT:  Pupils reactive, edentulous Cardiovascular:  irregular Lungs:  Scattered rhonchi, basilar rales Lt > Rt  Abdomen:  Soft, non tender, decreased bowel sounds Musculoskeletal:  No edema Skin:  No rashes  LABS:  CBC  Recent Labs Lab  12/13/13 0820 12/14/13 0410  WBC 26.0* 16.9*  HGB 11.1* 10.8*  HCT 36.3* 35.5*  PLT 548* 387   Coag's  Recent Labs Lab 12/13/13 0820 12/15/13 0400  APTT 50*  --   INR 3.24* 3.03*   BMET  Recent Labs Lab 12/13/13 0820 12/14/13 0410  NA 150* 151*  K 3.3* 3.0*  CL 117* 119*  CO2 15* 18*  BUN 32* 29*  CREATININE 2.18* 1.64*  GLUCOSE 87 75   Electrolytes  Recent Labs Lab 12/13/13 0820 12/14/13 0410  CALCIUM 6.6* 7.2*  MG 1.9  --   PHOS 4.5  --    Sepsis Markers  Recent Labs Lab 12/13/13 0820  LATICACIDVEN 2.2  PROCALCITON 0.39   ABG No results found for this basename: PHART, PCO2ART, PO2ART,  in the last 168 hours Liver Enzymes  Recent Labs Lab 12/13/13 0820  AST 24  ALT 11  ALKPHOS 154*  BILITOT 0.4  ALBUMIN 1.7*   Cardiac Enzymes  Recent Labs Lab 12/13/13 0820 12/13/13 1310 12/13/13 2200  TROPONINI <0.30 <0.30 <0.30  PROBNP 7829.0*  --   --    Glucose  Recent Labs Lab 12/14/13 1210 12/14/13 1226 12/14/13 1545 12/14/13 1944 12/14/13 2332 12/15/13 0739  GLUCAP 61* 126* 104* 201* 85 115*    Imaging Dg Chest Port 1 View  12/15/2013   CLINICAL DATA:  CVL placement  EXAM: PORTABLE CHEST - 1 VIEW  COMPARISON:  Chest x-ray from yesterday  FINDINGS: New right IJ central venous catheter, tip at the level of the distal SVC. No evidence of pneumothorax.  Stable heart size and mediastinal contours. There is improved aeration at the left base, although the retrocardiac opacity still dense. Haziness of the lower chest which may represent layering pleural fluid. Pulmonary edema.  IMPRESSION: 1. New right IJ catheter without adverse feature. 2. Stable bilateral lung opacities with probable layering pleural fluid. 3. Pulmonary venous congestion bordering on edema.   Electronically Signed   By: Jorje Guild M.D.   On: 12/15/2013 03:48   Dg Chest Port 1 View  12/14/2013   CLINICAL DATA:  Pneumonia.  EXAM: PORTABLE CHEST - 1 VIEW  COMPARISON:   12/13/2013  FINDINGS: The central line and endotracheal tube have been removed. There is improved aeration at the right lung base. Persistent densities at the left lung base suggest pleural fluid and atelectasis/airspace disease. There are old right rib fractures. Heart size is within normal limits and stable. Thoracic aorta is heavily calcified. No evidence for a pneumothorax.  IMPRESSION: Decreasing airspace disease at the right lung base.  Stable left basilar densities.  Removal of support apparatuses.   Electronically Signed   By: Markus Daft M.D.   On: 12/14/2013 08:16   Dg Abd Portable 1v  12/13/2013   CLINICAL DATA:  OG tube placement.  EXAM: PORTABLE ABDOMEN - 1 VIEW  COMPARISON:  None.  FINDINGS: NG tube tip is in the proximal to mid stomach. The side port is near the GE junction.  Nonobstructive bowel gas pattern. No supine evidence of free air. Surgical clips in the upper abdomen near the GE junction.  IMPRESSION: NG tube tip in the proximal to mid stomach.   Electronically Signed   By: Rolm Baptise M.D.   On: 12/13/2013 10:37     ASSESSMENT / PLAN:  PULMONARY A: Acute respiratory failure 2nd to PNA. P:   -tolerated extubation -BD's prn  CARDIOVASCULAR A:  Hypotension, Sepsis; recurrent 1/4 after back in A Fib Hx of A fib, recurrent PE on chronic coumadin. Hx of HTN, hyperlipidemia, chronic diastolic dysfx. P:  -continue amiodarone, wean phenylephrine goal to off -restart anticoagulation when he can take PO  RENAL A:   Acute kidney injury. Hyperkalemia. Acidosis. Hx of prostate cancer. Hypernatremia  P:   -increase d5w given hyponatremia -f/u BMET, ABG -monitor renal fx, urine outpt  GASTROINTESTINAL A:   Severe protein calorie malnutrition. P:   -NPO -protonix for SUP -swallow eval  HEMATOLOGIC A:   Anemia. Coagulopathy 2nd to chronic coumadin therapy. P:  -f/u CBC -restart coumadin when he can take PO  INFECTIOUS A:   PNA in SNF resident.  Allergy to  PCN. P:   -vancomycin, aztreonam, levaquin; tailor based on cx data -d/c tamiflu 1/3  ENDOCRINE A:   Hx of DM type II.   P:   -SSI  NEUROLOGIC A:   Acute encephalopathy. Dementia/ delirium P:   - haldol prn   Baltazar Apo, MD, PhD 12/15/2013, 9:58 AM [place] [facility] [phone] or if no answer [phone]

## 2013-12-15 NOTE — Procedures (Signed)
Central Venous Catheter Insertion Procedure Note Francis Dodson [id number] 05-16-1930  Procedure: Insertion of Central Venous Catheter at the Right IJ vein, ultrasound-guided Indications: Drug and/or fluid administration  Procedure Details Consent: Unable to obtain consent because of emergent medical necessity. Patient's family also was called but nobody answered the phone Time Out: Verified patient identification, verified procedure, site/side was marked, verified correct patient position, special equipment/implants available, medications/allergies/relevent history reviewed, required imaging and test results available.  Performed  Maximum sterile technique was used including antiseptics, cap, gloves, gown, hand hygiene, mask and sheet. Skin prep: Chlorhexidine; local anesthetic administered A antimicrobial bonded/coated triple lumen catheter was placed in the right internal jugular vein using the Seldinger technique.  Evaluation Blood flow good Complications: No apparent complications Patient did tolerate procedure well. Chest X-ray ordered to verify placement.  CXR: pending.  Francis Dodson 12/15/2013, 2:34 AM

## 2013-12-15 NOTE — Progress Notes (Signed)
eLink Physician-Brief Progress Note Patient Name: Francis Dodson DOB: 11-22-1930 MRN: [id number]  Date of Service  12/15/2013   HPI/Events of Note   Hypotensive and tachycardic.  eICU Interventions  Neo ordered.      YACOUB,WESAM 12/15/2013, 1:05 AM

## 2013-12-15 NOTE — Progress Notes (Signed)
Per MD note- patient is now extubated. Patient has multiple medical issues- could he possibly be a candidate for LTAC prior to return to SNF at [facility]?  CSW services will continue to monitor and provide support as needed.  Lorie Phenix. [place], [place]

## 2013-12-15 NOTE — Progress Notes (Signed)
eLink Physician-Brief Progress Note Patient Name: KIREE DEJARNETTE DOB: 26-Jun-1930 MRN: [id number]  Date of Service  12/15/2013   HPI/Events of Note   afib with RVR with hypotension.  eICU Interventions  Amio added to neo.      Keyonni Percival 12/15/2013, 1:06 AM

## 2013-12-15 NOTE — Progress Notes (Signed)
eLink Physician-Brief Progress Note Patient Name: Francis Dodson DOB: 1930/04/11 MRN: [id number]  Date of Service  12/15/2013   HPI/Events of Note   Agitated.  QT 0.32  eICU Interventions  Haldol 5 mg IV x1 ordered.      YACOUB,WESAM 12/15/2013, 6:30 AM

## 2013-12-16 DIAGNOSIS — I4891 Unspecified atrial fibrillation: Secondary | ICD-10-CM

## 2013-12-16 LAB — GLUCOSE, CAPILLARY
Glucose-Capillary: 105 mg/dL — ABNORMAL HIGH (ref 70–99)
Glucose-Capillary: 88 mg/dL (ref 70–99)
Glucose-Capillary: 80 mg/dL (ref 70–99)
Glucose-Capillary: 104 mg/dL — ABNORMAL HIGH (ref 70–99)
Glucose-Capillary: 108 mg/dL — ABNORMAL HIGH (ref 70–99)

## 2013-12-16 LAB — BASIC METABOLIC PANEL
BUN: 14 mg/dL (ref 6–23)
CALCIUM: 7.1 mg/dL — AB (ref 8.4–10.5)
CO2: 18 mEq/L — ABNORMAL LOW (ref 19–32)
Chloride: 104 mEq/L (ref 96–112)
Creatinine, Ser: 0.87 mg/dL (ref 0.50–1.35)
GFR calc Af Amer: >90 mL/min (ref >90)
GFR, EST NON AFRICAN AMERICAN: 79 mL/min — AB (ref >90)
Glucose, Bld: 102 mg/dL — ABNORMAL HIGH (ref 70–99)
Potassium: 2.6 mEq/L — CL (ref 3.7–5.3)
SODIUM: 136 meq/L — AB (ref 137–147)

## 2013-12-16 LAB — PROTIME-INR
INR: 2.54 — AB (ref 0.00–1.49)
PROTHROMBIN TIME: 26.5 s — AB (ref 11.6–15.2)

## 2013-12-16 MED ORDER — FENTANYL CITRATE 0.05 MG/ML IJ SOLN
12.5000 ug | INTRAMUSCULAR | Status: DC | PRN
  Administered 2013-12-16 – 2013-12-17 (×2): 50 ug via INTRAVENOUS
  Administered 2013-12-17: 25 ug via INTRAVENOUS
  Administered 2013-12-17 – 2013-12-20 (×3): 50 ug via INTRAVENOUS
  Filled 2013-12-16 (×6): qty 2

## 2013-12-16 MED ORDER — INSULIN ASPART 100 UNIT/ML ~~LOC~~ SOLN
0.0000 [IU] | Freq: Three times a day (TID) | SUBCUTANEOUS | Status: DC
  Administered 2013-12-18: 5 [IU] via SUBCUTANEOUS
  Administered 2013-12-19: 3 [IU] via SUBCUTANEOUS
  Administered 2013-12-19: 2 [IU] via SUBCUTANEOUS

## 2013-12-16 MED ORDER — ONDANSETRON HCL 4 MG/2ML IJ SOLN
4.0000 mg | Freq: Four times a day (QID) | INTRAMUSCULAR | Status: DC | PRN
  Administered 2013-12-16 – 2013-12-17 (×2): 4 mg via INTRAVENOUS
  Filled 2013-12-16: qty 2

## 2013-12-16 MED ORDER — POTASSIUM CHLORIDE 10 MEQ/50ML IV SOLN
10.0000 meq | INTRAVENOUS | Status: AC
  Administered 2013-12-16 (×4): 10 meq via INTRAVENOUS
  Filled 2013-12-16 (×3): qty 50

## 2013-12-16 MED ORDER — POTASSIUM CHLORIDE 10 MEQ/50ML IV SOLN
INTRAVENOUS | Status: AC
  Filled 2013-12-16: qty 50

## 2013-12-16 MED ORDER — SODIUM CHLORIDE 0.9 % IV SOLN
INTRAVENOUS | Status: DC
  Administered 2013-12-16: 11:00:00 via INTRAVENOUS

## 2013-12-16 MED ORDER — HALOPERIDOL LACTATE 5 MG/ML IJ SOLN
5.0000 mg | Freq: Once | INTRAMUSCULAR | Status: AC
  Administered 2013-12-17: 5 mg via INTRAVENOUS
  Filled 2013-12-16: qty 1

## 2013-12-16 MED ORDER — VANCOMYCIN HCL IN DEXTROSE 1-5 GM/200ML-% IV SOLN
1000.0000 mg | INTRAVENOUS | Status: DC
  Filled 2013-12-16: qty 200

## 2013-12-16 MED ORDER — MIRTAZAPINE 15 MG PO TABS
15.0000 mg | ORAL_TABLET | Freq: Every day | ORAL | Status: DC
  Administered 2013-12-16 – 2013-12-19 (×4): 15 mg via ORAL
  Filled 2013-12-16 (×6): qty 1

## 2013-12-16 MED ORDER — WARFARIN - PHARMACIST DOSING INPATIENT
Freq: Every day | Status: DC
  Administered 2013-12-16: 1

## 2013-12-16 MED ORDER — ATENOLOL 100 MG PO TABS
100.0000 mg | ORAL_TABLET | Freq: Every day | ORAL | Status: DC
  Administered 2013-12-16 – 2013-12-18 (×3): 100 mg via ORAL
  Filled 2013-12-16 (×4): qty 1

## 2013-12-16 MED ORDER — HALOPERIDOL LACTATE 5 MG/ML IJ SOLN
INTRAMUSCULAR | Status: AC
  Administered 2013-12-16: 5 mg
  Filled 2013-12-16: qty 1

## 2013-12-16 MED ORDER — ALPRAZOLAM 0.25 MG PO TABS
0.2500 mg | ORAL_TABLET | Freq: Three times a day (TID) | ORAL | Status: DC | PRN
  Administered 2013-12-16 – 2013-12-19 (×7): 0.25 mg via ORAL
  Filled 2013-12-16 (×7): qty 1

## 2013-12-16 MED ORDER — DEXTROSE 5 % IV SOLN
1.0000 g | Freq: Three times a day (TID) | INTRAVENOUS | Status: DC
  Administered 2013-12-16 – 2013-12-18 (×5): 1 g via INTRAVENOUS
  Filled 2013-12-16 (×9): qty 1

## 2013-12-16 MED ORDER — WARFARIN 0.5 MG HALF TABLET
0.5000 mg | ORAL_TABLET | Freq: Once | ORAL | Status: AC
  Administered 2013-12-16: 0.5 mg via ORAL
  Filled 2013-12-16: qty 1

## 2013-12-16 MED ORDER — LEVOFLOXACIN IN D5W 750 MG/150ML IV SOLN
750.0000 mg | INTRAVENOUS | Status: AC
  Administered 2013-12-17 – 2013-12-19 (×2): 750 mg via INTRAVENOUS
  Filled 2013-12-16 (×2): qty 150

## 2013-12-16 MED ORDER — PANTOPRAZOLE SODIUM 40 MG PO TBEC
40.0000 mg | DELAYED_RELEASE_TABLET | Freq: Every day | ORAL | Status: DC
  Administered 2013-12-16 – 2013-12-20 (×5): 40 mg via ORAL
  Filled 2013-12-16 (×5): qty 1

## 2013-12-16 MED ORDER — ONDANSETRON HCL 4 MG/2ML IJ SOLN
INTRAMUSCULAR | Status: AC
  Filled 2013-12-16: qty 2

## 2013-12-16 MED ORDER — SODIUM BICARBONATE 650 MG PO TABS
650.0000 mg | ORAL_TABLET | Freq: Three times a day (TID) | ORAL | Status: AC
  Administered 2013-12-16 – 2013-12-17 (×4): 650 mg via ORAL
  Filled 2013-12-16 (×4): qty 1

## 2013-12-16 NOTE — Evaluation (Signed)
Physical Therapy Evaluation Patient Details Name: Francis Dodson MRN: [id number] DOB: 17-Mar-1930 Today's Date: 12/16/2013 Time: 2376-2831 PT Time Calculation (min): 19 min  PT Assessment / Plan / Recommendation History of Present Illness  Pt from SNF admitted for acute respiratory distress.  Clinical Impression  Pt living at SNF PTA and was beginning ambulation with PT at facility. Pt currently further deconditioned and requires totalAx2 for all mobility. Pt to con't to benefit from SNF upon d/c to achieve maximal functional recovery.    PT Assessment  Patient needs continued PT services    Follow Up Recommendations  SNF    Does the patient have the potential to tolerate intense rehabilitation      Barriers to Discharge        Equipment Recommendations  None recommended by PT    Recommendations for Other Services     Frequency Min 2X/week    Precautions / Restrictions Precautions Precautions: Fall Precaution Comments: Pt with L LE shorter than other, Restrictions Weight Bearing Restrictions: No   Pertinent Vitals/Pain Denies pain      Mobility  Bed Mobility Bed Mobility: Supine to Sit;Sitting - Scoot to Edge of Bed Supine to Sit: 1: +2 Total assist;HOB elevated Supine to Sit: Patient Percentage: 30% Sitting - Scoot to Edge of Bed: 1: +2 Total assist Sitting - Scoot to Edge of Bed: Patient Percentage: 30% Details for Bed Mobility Assistance: pt required max verbal and tactile cues to complete task Transfers Transfers: Programmer, applications Transfers: 1: +2 Total assist Stand Pivot Transfers: Patient Percentage: 30% Details for Transfer Assistance: completed 2 sit to stand transfers Ambulation/Gait Ambulation/Gait Assistance: Not tested (comment)    Exercises     PT Diagnosis: Difficulty walking;Generalized weakness  PT Problem List: Decreased strength;Decreased activity tolerance;Decreased balance;Decreased mobility;Decreased cognition PT  Treatment Interventions: DME instruction;Gait training;Functional mobility training;Therapeutic activities;Therapeutic exercise     PT Goals(Current goals can be found in the care plan section) Acute Rehab PT Goals Patient Stated Goal: didn't state PT Goal Formulation: With patient Time For Goal Achievement: 12/30/13 Potential to Achieve Goals: Good  Visit Information  Last PT Received On: 12/16/13 Assistance Needed: +2 History of Present Illness: Pt from SNF admitted for acute respiratory distress.       Prior [place] expects to be discharged to:: Skilled nursing facility Prior Function Level of Independence: Needs assistance Gait / Transfers Assistance Needed: beginning to walk with PT at facility otherwise uses w/c as primary mode of mobility ADL's / Homemaking Assistance Needed: assist from staff Communication Communication: No difficulties Dominant Hand: Right    Cognition  Cognition Arousal/Alertness: Awake/alert Behavior During Therapy: WFL for tasks assessed/performed Overall Cognitive Status: History of cognitive impairments - at baseline (per grand-daughter pt with poor memory and confusion) Memory: Decreased short-term memory    Extremity/Trunk Assessment Upper Extremity Assessment Upper Extremity Assessment: Generalized weakness Lower Extremity Assessment Lower Extremity Assessment: Generalized weakness (L>R) Cervical / Trunk Assessment Cervical / Trunk Assessment: Kyphotic   Balance Balance Balance Assessed: Yes Static Sitting Balance Static Sitting - Balance Support: Feet supported;Bilateral upper extremity supported Static Sitting - Level of Assistance: 4: Min assist Static Sitting - Comment/# of Minutes: 5 min  End of Session PT - End of Session Equipment Utilized During Treatment: Gait belt Activity Tolerance: Patient tolerated treatment well Patient left: in chair;with call bell/phone within reach;with family/visitor  present Nurse Communication: Mobility status  GP     Kingsley Callander 12/16/2013, 1:58 PM  Kittie Plater, PT, DPT Pager #: [phone] Office #: [phone]

## 2013-12-16 NOTE — Progress Notes (Signed)
eLink Physician-Brief Progress Note Patient Name: Francis Dodson DOB: [birth year] MRN: [id number]  Date of Service  12/16/2013   HPI/Events of Note  Agitation and confusion this AM - attempting to get OOB.  Has pulled out CVL and foleys.  Responded to haldol yesterday AM.  eICU Interventions  Plan: 5 mg haldol IV times one dose now 12 lead EKG to evaluate QTc   Intervention Category Major Interventions: Delirium, psychosis, severe agitation - evaluation and management  DETERDING,ELIZABETH 12/16/2013, 3:46 AM

## 2013-12-16 NOTE — Clinical Social Work Note (Signed)
Clinical Social Worker continuing to follow patient and family for support and discharge planning needs.  Per facility, patient is able to return once medically stable.  Patient has been extubated and seems to be doing quite well from a respiratory standpoint.  CSW remains available for support and to facilitate patient discharge back to [place] once medically ready.  Barbette Or, [place]

## 2013-12-16 NOTE — Progress Notes (Signed)
Pt pulled central line out. Dr. Leonidas Romberg notified and asked for a PIV to hold him over until morning. Pt has poor vascular access, so IV team paged. IV team responded and informed of gtt's pt is receiving and the expeditious need for the PIV. Am sitting in room with pt to continue monitoring.

## 2013-12-16 NOTE — Progress Notes (Signed)
Name: Francis Dodson MRN: [id number] DOB: 1930/04/04    ADMISSION DATE:  12/13/2013  REFERRING MD :  Lovie Macadamia  CHIEF COMPLAINT:  Short of breath  BRIEF PATIENT DESCRIPTION:  80 yo male from SNF went to [facility] ER with difficulty breathing, and found to have PNA.  Intubated, and transferred to [facility] for further treatment.  SIGNIFICANT EVENTS: 1/02 Transfer to [facility] from [facility] 1/04 A fib, low BP >> amiodarone, phenylephrine; agitated >> haldol  STUDIES:   LINES / TUBES: ETT 1/02 >>1/2 Lt IJ CVL 1/02 >>1/3 (pt pulled) R IJ CVC 1/4 >>   CULTURES: Blood 1/02 ([facility]) >> Sputum 1/02 >> Influenza PCR 1/02 >>neg Pneumococcal Ag 1/02 >>neg Legionella Ag 1/02 >> negaitive C diff 1/03 >> negative  ANTIBIOTICS: Vancomycin 1/02 >> 1/5 Aztreonam 1/02 >> Levaquin 1/02 >> Tamiflu 1/02 >> 1/3  SUBJECTIVE:  Remains in A fib with RVR.  Denies chest pain.  Breathing okay.  Denies abdominal pain.  VITAL SIGNS: Temp:  [96.8 F (36 C)-98.3 F (36.8 C)] 97.4 F (36.3 C) (01/05 0400) Pulse Rate:  [69-152] 91 (01/05 0700) Resp:  [14-24] 19 (01/05 0700) BP: (84-128)/(42-89) 100/58 mmHg (01/05 0700) SpO2:  [82 %-100 %] 100 % (01/05 0700) Weight:  [136 lb 3.9 oz (61.8 kg)] 136 lb 3.9 oz (61.8 kg) (01/05 0300) Room air  INTAKE / OUTPUT: Intake/Output     01/04 0701 - 01/05 0700 01/05 0701 - 01/06 0700   P.O. 180    I.V. (mL/kg) 3132.4 (50.7)    IV Piggyback 300    Total Intake(mL/kg) 3612.4 (58.5)    Urine (mL/kg/hr) 890 (0.6)    Total Output 890     Net +2722.4          Stool Occurrence 2 x      PHYSICAL EXAMINATION: General: No distress Neuro: Alert, follows commands, moves extremities HEENT:  Pupils reactive, edentulous Cardiovascular:  irregular Lungs:  Scattered rhonchi, basilar rales Lt > Rt  Abdomen:  Soft, non tender Musculoskeletal:  No edema Skin:  No rashes  LABS:  CBC  Recent Labs Lab 12/13/13 0820 12/14/13 0410  WBC 26.0* 16.9*  HGB 11.1* 10.8*   HCT 36.3* 35.5*  PLT 548* 387   Coag's  Recent Labs Lab 12/13/13 0820 12/15/13 0400 12/16/13 0535  APTT 50*  --   --   INR 3.24* 3.03* 2.54*   BMET  Recent Labs Lab 12/13/13 0820 12/14/13 0410 12/16/13 0535  NA 150* 151* 136*  K 3.3* 3.0* 2.6*  CL 117* 119* 104  CO2 15* 18* 18*  BUN 32* 29* 14  CREATININE 2.18* 1.64* 0.87  GLUCOSE 87 75 102*   Electrolytes  Recent Labs Lab 12/13/13 0820 12/14/13 0410 12/16/13 0535  CALCIUM 6.6* 7.2* 7.1*  MG 1.9  --   --   PHOS 4.5  --   --    Sepsis Markers  Recent Labs Lab 12/13/13 0820  LATICACIDVEN 2.2  PROCALCITON 0.39   Liver Enzymes  Recent Labs Lab 12/13/13 0820  AST 24  ALT 11  ALKPHOS 154*  BILITOT 0.4  ALBUMIN 1.7*   Cardiac Enzymes  Recent Labs Lab 12/13/13 0820 12/13/13 1310 12/13/13 2200  TROPONINI <0.30 <0.30 <0.30  PROBNP 7829.0*  --   --    Glucose  Recent Labs Lab 12/15/13 1202 12/15/13 1640 12/15/13 1945 12/15/13 2339 12/16/13 0416 12/16/13 0757  GLUCAP 118* 114* 122* 114* 105* 88    Imaging Dg Chest Port 1 View  12/15/2013  CLINICAL DATA:  CVL placement  EXAM: PORTABLE CHEST - 1 VIEW  COMPARISON:  Chest x-ray from yesterday  FINDINGS: New right IJ central venous catheter, tip at the level of the distal SVC. No evidence of pneumothorax.  Stable heart size and mediastinal contours. There is improved aeration at the left base, although the retrocardiac opacity still dense. Haziness of the lower chest which may represent layering pleural fluid. Pulmonary edema.  IMPRESSION: 1. New right IJ catheter without adverse feature. 2. Stable bilateral lung opacities with probable layering pleural fluid. 3. Pulmonary venous congestion bordering on edema.   Electronically Signed   By: Jorje Guild M.D.   On: 12/15/2013 03:48     ASSESSMENT / PLAN:  PULMONARY A: Acute respiratory failure 2nd to PNA. P:   -f/u CXR intermittently -oxygen as needed to keep SpO2 > 92% -BD's  prn  CARDIOVASCULAR A:  Hypotension, Sepsis; recurrent 1/04 after back in A Fib >> off pressors 1/04. 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 Hx of HTN, hyperlipidemia, chronic diastolic dysfx. P:  -continue amiodarone -pharmacy to dose coumadin -resume tenormin 1/05 -may need cardiology evaluation if unable to wean off amiodarone soon -keep CVL in for now  RENAL A:   Acute kidney injury >> resolved. Hyperkalemia. Acidosis. Hx of prostate cancer. Hypernatremia >> resolved. P:   -f/u and replace electrolytes as needed -monitor renal fx, urine outpt -keep foley in for now  GASTROINTESTINAL A:   Severe protein calorie malnutrition. P:   -D3 diet -protonix for SUP  HEMATOLOGIC A:   Anemia. Coagulopathy 2nd to chronic coumadin therapy. P:  -f/u CBC  INFECTIOUS A:   PNA in SNF resident.  Allergy to PCN.  GNR noted in sputum cx. P:   -continue aztreonam, levaquin; tailor based on cx data -d/c vancomycin 1/05  ENDOCRINE A:   Hx of DM type II.   P:   -SSI  NEUROLOGIC A:   Acute encephalopathy >> resolved. Hx of mild dementia. P:   - haldol prn - resume remeron   Updated family at bedside.  Chesley Mires, MD [facility] Pulmonary/Critical Care 12/16/2013, 9:42 AM Pager:  [phone] After 3pm call: [phone]

## 2013-12-16 NOTE — Progress Notes (Signed)
[facility] Progress Note Patient Name: Francis Dodson DOB: 1930-05-07 MRN: [id number]  Date of Service  12/16/2013   HPI/Events of Note  Hypokalemia   eICU Interventions  Potassium replaced   Intervention Category Intermediate Interventions: Electrolyte abnormality - evaluation and management  DETERDING,ELIZABETH 12/16/2013, 6:26 AM

## 2013-12-16 NOTE — Progress Notes (Signed)
ANTIBIOTIC CONSULT NOTE - FOLLOW UP  Pharmacy Consult for Vancomycin, Azactam, Levaquin Indication: pneumonia  Allergies  Allergen Reactions  . Aspirin   . Nabumetone   . Penicillins   . Piroxicam   . Salicylates    Patient Measurements: Height: 4\' 10"  (147.3 cm) Weight: 136 lb 3.9 oz (61.8 kg) IBW/kg (Calculated) : 45.4  Vital Signs: Temp: 97.4 F (36.3 C) (01/05 0400) Temp src: Axillary (01/05 0400) BP: 100/58 mmHg (01/05 0700) Pulse Rate: 91 (01/05 0700) Intake/Output from previous day: 01/04 0701 - 01/05 0700 In: 3612.4 [P.O.:180; I.V.:3132.4; IV Piggyback:300] Out: 890 [Urine:890] Intake/Output from this shift:    Labs:  Recent Labs  12/14/13 0410 12/16/13 0535  WBC 16.9*  --   HGB 10.8*  --   PLT 387  --   CREATININE 1.64* 0.87   Estimated Creatinine Clearance: 49 ml/min (by C-G formula based on Cr of 0.87).  Assessment: 83yom continues on day#3 antibiotics for pneumonia. Renal function continues to improve with sCr down to 0.87 and CrCl now 64ml/min. Will adjust doses.  1/2 aztreonam>> 1/2 LVQ>> 1/2 vanc>> 1/2 Tamiflu >> 1/3  1/2 MRSA PCR - POS 1/2 influ PCR - NEG 1/2 Resp - few GNR 1/3 Cdiff - NEG  1/2 bcx (from [facility])>>  Goal of Therapy:  Vancomycin trough level 15-20 mcg/ml  Plan:  1) Increase vancomycin to 1g IV q24 2) Increase aztreonam to 1g IV q8 3) Increase levaquin to 750mg  IV q48  Deboraha Sprang 12/16/2013,9:15 AM

## 2013-12-16 NOTE — Progress Notes (Addendum)
ANTICOAGULATION CONSULT NOTE - Initial Consult  Pharmacy Consult for Coumadin Indication: atrial fibrillation, hx PE  Allergies  Allergen Reactions  . Aspirin   . Nabumetone   . Penicillins   . Piroxicam   . Salicylates     Patient Measurements: Height: 4\' 10"  (147.3 cm) Weight: 136 lb 3.9 oz (61.8 kg) IBW/kg (Calculated) : 45.4  Vital Signs: Temp: 97.4 F (36.3 C) (01/05 0400) Temp src: Axillary (01/05 0400) BP: 100/58 mmHg (01/05 0700) Pulse Rate: 91 (01/05 0700)  Labs:  Recent Labs  12/13/13 1310 12/13/13 2200 12/14/13 0410 12/15/13 0400 12/16/13 0535  HGB  --   --  10.8*  --   --   HCT  --   --  35.5*  --   --   PLT  --   --  387  --   --   LABPROT  --   --   --  30.3* 26.5*  INR  --   --   --  3.03* 2.54*  CREATININE  --   --  1.64*  --  0.87  TROPONINI <0.30 <0.30  --   --   --     Estimated Creatinine Clearance: 49 ml/min (by C-G formula based on Cr of 0.87).   Medical History: Past Medical History  Diagnosis Date  . Atrial fibrillation   . Diabetes mellitus   . Pulmonary embolism   . Hypertension   . Prostate cancer   . Polio Age 50    Left side weakness  . Hyperlipidemia   . Diastolic heart failure    Assessment: 83yom on coumadin pta for afib, hx PE. Admitted 12/13/13 with a supratherapeutic INR and coumadin held. Today INR is back within goal range and coumadin to resume. Will need to watch INR closely as he is on an amiodarone gtt and levaquin.  PTA dose: 0.5mg  daily  Goal of Therapy:  INR 2-3 Monitor platelets by anticoagulation protocol: Yes   Plan:  1) Coumadin 0.5mg  x 1 tonight 2) INR in AM  Deboraha Sprang 12/16/2013,9:51 AM

## 2013-12-17 ENCOUNTER — Encounter (HOSPITAL_COMMUNITY): Payer: Self-pay | Admitting: Pulmonary Disease

## 2013-12-17 DIAGNOSIS — R410 Disorientation, unspecified: Secondary | ICD-10-CM | POA: Diagnosis not present

## 2013-12-17 DIAGNOSIS — J151 Pneumonia due to Pseudomonas: Secondary | ICD-10-CM

## 2013-12-17 DIAGNOSIS — I4891 Unspecified atrial fibrillation: Secondary | ICD-10-CM | POA: Diagnosis present

## 2013-12-17 DIAGNOSIS — R404 Transient alteration of awareness: Secondary | ICD-10-CM

## 2013-12-17 LAB — BASIC METABOLIC PANEL
BUN: 14 mg/dL (ref 6–23)
CALCIUM: 7.4 mg/dL — AB (ref 8.4–10.5)
CHLORIDE: 98 meq/L (ref 96–112)
CO2: 18 meq/L — AB (ref 19–32)
CREATININE: 0.95 mg/dL (ref 0.50–1.35)
GFR calc Af Amer: 88 mL/min — ABNORMAL LOW (ref >90)
GFR calc non Af Amer: 76 mL/min — ABNORMAL LOW (ref >90)
GLUCOSE: 75 mg/dL (ref 70–99)
Potassium: 3.6 mEq/L — ABNORMAL LOW (ref 3.7–5.3)
Sodium: 132 mEq/L — ABNORMAL LOW (ref 137–147)

## 2013-12-17 LAB — CBC
HEMATOCRIT: 33.4 % — AB (ref 39.0–52.0)
Hemoglobin: 10.7 g/dL — ABNORMAL LOW (ref 13.0–17.0)
MCH: 26.6 pg (ref 26.0–34.0)
MCHC: 32 g/dL (ref 30.0–36.0)
MCV: 83.1 fL (ref 78.0–100.0)
PLATELETS: 336 10*3/uL (ref 150–400)
RBC: 4.02 MIL/uL — ABNORMAL LOW (ref 4.22–5.81)
RDW: 21.3 % — ABNORMAL HIGH (ref 11.5–15.5)
WBC: 9.5 10*3/uL (ref 4.0–10.5)

## 2013-12-17 LAB — CULTURE, RESPIRATORY

## 2013-12-17 LAB — CULTURE, RESPIRATORY W GRAM STAIN

## 2013-12-17 LAB — PROTIME-INR
INR: 2.45 — ABNORMAL HIGH (ref 0.00–1.49)
Prothrombin Time: 25.8 seconds — ABNORMAL HIGH (ref 11.6–15.2)

## 2013-12-17 LAB — GLUCOSE, CAPILLARY
GLUCOSE-CAPILLARY: 82 mg/dL (ref 70–99)
Glucose-Capillary: 89 mg/dL (ref 70–99)
Glucose-Capillary: 88 mg/dL (ref 70–99)
Glucose-Capillary: 127 mg/dL — ABNORMAL HIGH (ref 70–99)

## 2013-12-17 LAB — MAGNESIUM: Magnesium: 1.8 mg/dL (ref 1.5–2.5)

## 2013-12-17 MED ORDER — ENSURE COMPLETE PO LIQD
237.0000 mL | Freq: Two times a day (BID) | ORAL | Status: DC
  Administered 2013-12-17 – 2013-12-20 (×8): 237 mL via ORAL

## 2013-12-17 MED ORDER — RISPERIDONE 1 MG PO TABS
1.0000 mg | ORAL_TABLET | Freq: Two times a day (BID) | ORAL | Status: DC
  Administered 2013-12-17 – 2013-12-20 (×7): 1 mg via ORAL
  Filled 2013-12-17 (×10): qty 1

## 2013-12-17 MED ORDER — AMIODARONE HCL 200 MG PO TABS
200.0000 mg | ORAL_TABLET | Freq: Two times a day (BID) | ORAL | Status: DC
  Administered 2013-12-17 – 2013-12-19 (×6): 200 mg via ORAL
  Filled 2013-12-17 (×9): qty 1

## 2013-12-17 MED ORDER — LOPERAMIDE HCL 2 MG PO CAPS
2.0000 mg | ORAL_CAPSULE | ORAL | Status: DC | PRN
  Administered 2013-12-17: 2 mg via ORAL
  Filled 2013-12-17 (×2): qty 1

## 2013-12-17 MED ORDER — APIXABAN 5 MG PO TABS
5.0000 mg | ORAL_TABLET | Freq: Two times a day (BID) | ORAL | Status: DC
  Administered 2013-12-17 – 2013-12-20 (×6): 5 mg via ORAL
  Filled 2013-12-17 (×8): qty 1

## 2013-12-17 NOTE — Progress Notes (Signed)
Speech Language Pathology Treatment: Dysphagia  Patient Details Name: Francis Dodson MRN: [id number] DOB: Mar 31, 1930 Today's Date: 12/17/2013 Time: 1410-1430 SLP Time Calculation (min): 20 min  Assessment / Plan / Recommendation Clinical Impression  Pt demonstrated adequate tolerance of thin liquids and soft solids though there was mild oral holding and pumping behavior with solids. Daughter present reports pt often "chews and chews and then spits out the meat."  Despite this, the soft texture pt is on (dys 3) is acceptable to pt and family as this is his baseline behavior. Pt was observed to have a pill in the bed, informed RN and SLP assisted RN in pt taking the pill without pocketing. Best method appeared to be presenting it directly as a pill given with water. Pt will pocket if given whole in pudding or puree. No f/u needed at this time, pt at baseline and tolerating diet.    HPI HPI: 80 yo male from SNF went to [facility] ER 1/2 with difficulty breathing, and found to have PNA. Intubated, and transferred to [facility] for further treatment. ETT 1/2-1/2. Initial CXR 1/2: The lungs are adequately inflated with mild worsening of a bibasilar process right worse than left likely due to infection with associated small effusions/atelectasis   Pertinent Vitals NA  SLP Plan  All goals met    Recommendations Diet recommendations: Dysphagia 3 (mechanical soft);Thin liquid Liquids provided via: Cup;Straw Medication Administration: Whole meds with liquid Supervision: Patient able to self feed;Intermittent supervision to cue for compensatory strategies Compensations: Slow rate;Small sips/bites Postural Changes and/or Swallow Maneuvers: Seated upright 90 degrees              Oral Care Recommendations: Oral care BID Follow up Recommendations: None Plan: All goals met    GO    Herbie Baltimore, MA CCC-SLP [phone]   Lynann Beaver 12/17/2013, 2:50 PM

## 2013-12-17 NOTE — Consult Note (Addendum)
CARDIOLOGY CONSULT NOTE     Patient ID: Francis Dodson MRN: [id number] DOB/AGE: 01-07-1930 80 y.o.  Admit date: 12/13/2013 Referring Physician Chesley Mires MD Primary Physician  Primary Cardiologist N/A Reason for Consultation Atrial fibrillation  HPI: Francis Dodson is seen at the request of Dr. Halford Chessman for evaluation of atrial fibrillation. He is a 80 year old white male transferred from [facility] in [place], [place] for treatment of pneumonia. He has a history of terminated atrial fibrillation. He has been on Coumadin therapy for anticoagulation. He has been managed with rate control with atenolol. On admission here the patient's heart rate increased up to 150 beats per minute. It was difficult to control the rate. Medical therapy was limited by low blood pressure. He has been on amiodarone IV for the last 4 days. Atenolol was just resumed yesterday. Currently his heart rate is under control with a rate of 90 beats per minute. The family is concerned about the long-term anticoagulation. He has been on Coumadin but according to the family his INRs have been very difficult to regulate. He has had some minor falls at his nursing facility related to his chronic polio and general debility. Patient has no known history of coronary disease or myocardial infarction. He does carry a diagnosis of diastolic heart failure. Echocardiogram in September 2014 was fairly unremarkable. He has no prior history of stroke or CVA. He has no recent bleeding difficulties. His family reports a history of GI bleeding 30 years ago. He does have a history of polio predominantly affecting his left leg. He does have a history of hypertension. Diabetes is listed in his history but the family denies any history of diabetes in him. He does have a history of acute pulmonary embolus following a femur fracture in 2006. To the family's knowledge he has had no other history of thromboembolism.  Review of systems complete and found  to be negative unless listed above   Past Medical History  Diagnosis Date  . Atrial fibrillation   . Diabetes mellitus   . Pulmonary embolism 2006    After orthopedic procedure  . Hypertension   . Prostate cancer   . Polio Age 15    Left side weakness  . Hyperlipidemia   . Diastolic heart failure     Family History  Problem Relation Age of Onset  . CAD Mother   . Pancreatic cancer Brother   . CAD Sister     History   Social History  . Marital Status: Widowed    Spouse Name: N/A    Number of Children: N/A  . Years of Education: N/A   Occupational History  . Not on file.   Social History Main Topics  . Smoking status: Former Smoker    Quit date: 11/24/2013  . Smokeless tobacco: Not on file  . Alcohol Use: Not on file  . Drug Use: Not on file  . Sexual Activity: Not on file   Other Topics Concern  . Not on file   Social History Narrative  . No narrative on file    Past Surgical History  Procedure Laterality Date  . Hip fracture surgery       Prescriptions prior to admission  Medication Sig Dispense Refill  . ALPRAZolam (XANAX) 0.25 MG tablet Take 0.25 mg by mouth at bedtime as needed for anxiety.      Marland Kitchen atenolol (TENORMIN) 100 MG tablet Take 100 mg by mouth daily.      Marland Kitchen esomeprazole (NEXIUM) 40 MG  capsule Take 40 mg by mouth daily at 12 noon.      . furosemide (LASIX) 40 MG tablet Take 40 mg by mouth 2 (two) times daily.      . mirtazapine (REMERON) 15 MG tablet Take 15 mg by mouth at bedtime.      . Nutritional Supplements (PROMOD PO) Take 30 mLs by mouth 2 (two) times daily.      . potassium chloride SA (K-DUR,KLOR-CON) 20 MEQ tablet Take 40 mEq by mouth daily.      Marland Kitchen warfarin (COUMADIN) 1 MG tablet Take 0.5 mg by mouth daily at 6 PM.        Physical Exam: Blood pressure 104/63, pulse 94, temperature 98.2 F (36.8 C), temperature source Axillary, resp. rate 19, height 4\' 10"  (1.473 m), weight 141 lb 5 oz (64.1 kg), SpO2 100.00%.  He is an elderly white  male in no acute distress. He is pleasantly confused. HEENT: Normocephalic, atraumatic. Pupils are equal round and reactive to light and accommodation. Oropharynx is clear with dry mucous membranes. Neck: No JVD or bruits. No adenopathy or thyromegaly. Lungs: Scattered rhonchi. Cardiovascular: Irregular rate and rhythm. No gallop, murmur, or click. Abdomen: Soft and nontender. No masses or hepatosplenomegaly. Bowel sounds are positive. Extremities: No cyanosis or edema. Pedal pulses are 2+ and symmetric. Skin: Warm and dry. No rashes. Musculoskeletal. Orthopedic deformity of the left lower extremity related to history of polio. Neuro: Alert but confused. Oriented x1 only. No focal findings. Labs:   Lab Results  Component Value Date   WBC 9.5 12/17/2013   HGB 10.7* 12/17/2013   HCT 33.4* 12/17/2013   MCV 83.1 12/17/2013   PLT 336 12/17/2013    Recent Labs Lab 12/13/13 0820  12/17/13 0415  NA 150*  < > 132*  K 3.3*  < > 3.6*  CL 117*  < > 98  CO2 15*  < > 18*  BUN 32*  < > 14  CREATININE 2.18*  < > 0.95  CALCIUM 6.6*  < > 7.4*  PROT 5.7*  --   --   BILITOT 0.4  --   --   ALKPHOS 154*  --   --   ALT 11  --   --   AST 24  --   --   GLUCOSE 87  < > 75  < > = values in this interval not displayed. Lab Results  Component Value Date   TROPONINI <0.30 12/13/2013    No results found for this basename: CHOL   No results found for this basename: HDL   No results found for this basename: LDLCALC   No results found for this basename: TRIG   No results found for this basename: CHOLHDL   No results found for this basename: LDLDIRECT      Radiology:EXAM: PORTABLE CHEST - 1 VIEW  COMPARISON: Chest x-ray from yesterday  FINDINGS: New right IJ central venous catheter, tip at the level of the distal SVC. No evidence of pneumothorax.  Stable heart size and mediastinal contours. There is improved aeration at the left base, although the retrocardiac opacity still dense. Haziness of the  lower chest which may represent layering pleural fluid. Pulmonary edema.  IMPRESSION: 1. New right IJ catheter without adverse feature. 2. Stable bilateral lung opacities with probable layering pleural fluid. 3. Pulmonary venous congestion bordering on edema.   Electronically Signed By: Jorje Guild M.D. On: 12/15/2013 03:48   EKG: Dated 12/26/2013- atrial fibrillation with rate of 102 beats per  minute. Low voltage. Nonspecific ST-T wave changes. QT prolongation.  Echo: 08/29/13 at [facility]: Normal. EF 65-70%  ASSESSMENT AND PLAN:  1. Atrial fibrillation. Earlier heart rate was difficult to control related to his acute pneumonia and hypotension. Now blood pressure has improved and heart rate is currently under good control. Atenolol is resumed. Will DC IV  amiodarone and start by mouth amiodarone at 200 mg twice a day. I would anticipate that with resolution of his acute illness he will be able to be discharged on beta blocker therapy only. 2. Anticoagulation. He has a [place] vascular score of 4 placing him at high risk of CVA. There is no current indication for anticoagulation for history of thromboembolism. Given the difficulty regulating his Coumadin I would recommend switching him to Eliquis. He may need a lower dose of 2.5 mg twice a day based on his age and weight but I have asked pharmacy to review. 3. History of diastolic heart failure. Currently euvolemic. Echocardiogram previously was unremarkable. 4. Pneumonia improving 5. Acute kidney failure-resolved. 6. History of hypertension 7. ICU delirium.  SignedCollier Salina [facility] 12/17/2013, 11:45 AM

## 2013-12-17 NOTE — Progress Notes (Signed)
Name: Francis Dodson MRN: [id number] DOB: 05/01/1930    ADMISSION DATE:  12/13/2013  REFERRING MD :  Lovie Macadamia  CHIEF COMPLAINT:  Short of breath  BRIEF PATIENT DESCRIPTION:  80 yo male from SNF went to [facility] ER with difficulty breathing, and found to have PNA.  Intubated, and transferred to [facility] for further treatment.  SIGNIFICANT EVENTS: 1/02 Transfer to [facility] from [facility] 1/04 A fib, low BP >> amiodarone, phenylephrine; agitated >> haldol  STUDIES:   LINES / TUBES: ETT 1/02 >>1/2 Lt IJ CVL 1/02 >>1/3 (pt pulled) R IJ CVC 1/4 >> 1/6 (pt pulled)  CULTURES: Blood 1/02 ([facility]) >> Sputum 1/02 >> Pseudomonas Influenza PCR 1/02 >>negative Pneumococcal Ag 1/02 >>negative Legionella Ag 1/02 >> negaitive C diff 1/03 >> negative  ANTIBIOTICS: Vancomycin 1/02 >> 1/5 Aztreonam 1/02 >> Levaquin 1/02 >> Tamiflu 1/02 >> 1/3  SUBJECTIVE:  Pulled CVL out.  HR better.  More confused over night.  VITAL SIGNS: Temp:  [97 F (36.1 C)-98.2 F (36.8 C)] 98.2 F (36.8 C) (01/06 0800) Pulse Rate:  [40-128] 103 (01/06 0800) Resp:  [15-32] 20 (01/06 0800) BP: (66-156)/(28-94) 156/92 mmHg (01/06 0800) SpO2:  [91 %-100 %] 100 % (01/06 0800) Weight:  [141 lb 5 oz (64.1 kg)] 141 lb 5 oz (64.1 kg) (01/06 0500) Room air  INTAKE / OUTPUT: Intake/Output     01/05 0701 - 01/06 0700 01/06 0701 - 01/07 0700   P.O. 720    I.V. (mL/kg) 1023.9 (16) 36.7 (0.6)   IV Piggyback 250    Total Intake(mL/kg) 1993.9 (31.1) 36.7 (0.6)   Urine (mL/kg/hr) 925 (0.6) 50 (0.3)   Total Output 925 50   Net +1068.9 -13.3          PHYSICAL EXAMINATION: General: No distress Neuro: confused, moves all extremities HEENT:  Pupils reactive, edentulous Cardiovascular:  irregular Lungs:  Scattered rhonchi Abdomen:  Soft, non tender Musculoskeletal:  No edema Skin:  No rashes  LABS:  CBC  Recent Labs Lab 12/13/13 0820 12/14/13 0410 12/17/13 0415  WBC 26.0* 16.9* 9.5  HGB 11.1* 10.8* 10.7*   HCT 36.3* 35.5* 33.4*  PLT 548* 387 336   Coag's  Recent Labs Lab 12/13/13 0820 12/15/13 0400 12/16/13 0535 12/17/13 0415  APTT 50*  --   --   --   INR 3.24* 3.03* 2.54* 2.45*   BMET  Recent Labs Lab 12/14/13 0410 12/16/13 0535 12/17/13 0415  NA 151* 136* 132*  K 3.0* 2.6* 3.6*  CL 119* 104 98  CO2 18* 18* 18*  BUN 29* 14 14  CREATININE 1.64* 0.87 0.95  GLUCOSE 75 102* 75   Electrolytes  Recent Labs Lab 12/13/13 0820 12/14/13 0410 12/16/13 0535 12/17/13 0415  CALCIUM 6.6* 7.2* 7.1* 7.4*  MG 1.9  --   --  1.8  PHOS 4.5  --   --   --    Sepsis Markers  Recent Labs Lab 12/13/13 0820  LATICACIDVEN 2.2  PROCALCITON 0.39   Liver Enzymes  Recent Labs Lab 12/13/13 0820  AST 24  ALT 11  ALKPHOS 154*  BILITOT 0.4  ALBUMIN 1.7*   Cardiac Enzymes  Recent Labs Lab 12/13/13 0820 12/13/13 1310 12/13/13 2200  TROPONINI <0.30 <0.30 <0.30  PROBNP 7829.0*  --   --    Glucose  Recent Labs Lab 12/16/13 0416 12/16/13 0757 12/16/13 1209 12/16/13 1734 12/16/13 2159 12/17/13 0828  GLUCAP 105* 88 80 104* 108* 82    Imaging No results found.  ASSESSMENT / PLAN:  PULMONARY A: Acute respiratory failure 2nd to PNA. P:   -f/u CXR 1/7 -oxygen as needed to keep SpO2 > 92% -BD's prn  CARDIOVASCULAR A:  Hypotension, Sepsis; recurrent 1/04 after back in A Fib >> off pressors 1/04. Hx of A fib, recurrent PE on chronic coumadin >> family concerned about long term risk of coumadin. Hx of HTN, hyperlipidemia, chronic diastolic dysfx. P:  -continue amiodarone >> defer adjustments to cardiology -consult cardiology 1/6 -d/c coumadin and then eventually transition to aspirin -resumed tenormin 1/05  RENAL A:   Acute kidney injury >> resolved. Hypokalemia >> improved. Acidosis. Hx of prostate cancer. Hypernatremia >> resolved. P:   -f/u and replace electrolytes as needed -monitor renal fx, urine outpt -keep foley in for now -continue  bicarbonate for now  GASTROINTESTINAL A:   Severe protein calorie malnutrition. P:   -D3 diet -protonix for SUP  HEMATOLOGIC A:   Anemia. P:  -f/u CBC  INFECTIOUS A:   PNA in SNF resident >> Pseudomonas in sputum.  Allergy to PCN. P:   -continue aztreonam, levaquin for now  ENDOCRINE A:   Hx of DM type II.   P:   -SSI  NEUROLOGIC A:   Acute encephalopathy with agitated delirium. Hx of mild dementia. P:   - add risperdal 1/6 - resumed remeron  Summary: Respiratory status improved.  Will continue current Abx for Pseudomonal PNA.  Difficult controlling A fib >> have consulted cardiology.  Family also concerned about long term risks of coumadin and would like to d/c coumadin.  Will add risperdal for agitation >> likely will improve once he can be transferred out of ICU.   Updated family at bedside.  Chesley Mires, MD [facility] Pulmonary/Critical Care 12/17/2013, 9:28 AM Pager:  [phone] After 3pm call: [phone]

## 2013-12-17 NOTE — Progress Notes (Addendum)
ANTICOAGULATION CONSULT NOTE - Follow Up Consult  Pharmacy Consult for Coumadin>>Apixaban Indication: atrial fibrillation, hx PE  Allergies  Allergen Reactions  . Aspirin   . Nabumetone   . Penicillins   . Piroxicam   . Salicylates     Patient Measurements: Height: 4\' 10"  (147.3 cm) Weight: 141 lb 5 oz (64.1 kg) IBW/kg (Calculated) : 45.4  Vital Signs: Temp: 98.2 F (36.8 C) (01/06 0800) Temp src: Axillary (01/06 0800) BP: 156/92 mmHg (01/06 0800) Pulse Rate: 103 (01/06 0800)  Labs:  Recent Labs  12/15/13 0400 12/16/13 0535 12/17/13 0415  HGB  --   --  10.7*  HCT  --   --  33.4*  PLT  --   --  336  LABPROT 30.3* 26.5* 25.8*  INR 3.03* 2.54* 2.45*  CREATININE  --  0.87 0.95    Estimated Creatinine Clearance: 45.6 ml/min (by C-G formula based on Cr of 0.95).  Assessment: 83yom resumed on coumadin last evening for afib, hx PE. INR is therapeutic. CBC stable. No bleeding reported. Will need to watch INR closely as he is on an amiodarone gtt and levaquin.  PTA dose 0.5mg  daily  Goal of Therapy:  INR 2-3 Monitor platelets by anticoagulation protocol: Yes   Plan:  1) Repeat coumadin 0.5mg  x 1 2) INR in AM  Deboraha Sprang 12/17/2013,8:31 AM  Pharmacy has been asked to switch from coumadin to apixaban for afib. Since his INR is 2.45, will wait to give first dose tonight when INR will likely be <2. He qualifies for 5mg  bid dosing given weight >60kg and sCr < 1.5.   Plan: 1) Apixaban 5mg  po bid - first dose tonight  Deboraha Sprang 12/17/2013, 11:50 AM

## 2013-12-17 NOTE — Significant Event (Signed)
In review of records pt had PE in April 2006 after having orthopedic surgery.  Family not aware of any other instances of thromboembolic disease.  Therefore he likely does not warrant anti-coagulation for thrombo-embolic disease.  Family discussed options of continued anti-coagulation for A fib >> concern with coumadin has been difficulty in regulating levels.  They would be open to option of adding eliquis >> defer to cardiology.  Chesley Mires, MD [facility] Pulmonary/Critical Care 12/17/2013, 11:34 AM Pager:  [phone] After 3pm call: [phone]

## 2013-12-17 NOTE — Progress Notes (Signed)
NUTRITION FOLLOW UP  Intervention:   Ensure Complete po BID, each supplement provides 350 kcal and 13 grams of protein  Nutrition Dx:   Inadequate oral intake now related to agitation as evidenced by variable intake at meals.   Goal:   Pt to meet >/= 90% of their estimated nutrition needs   Monitor:   PO intake, weight trend, labs  Assessment:   Pt admitted from SNF with PNA and difficulty breathing. Pt extubated 1/2.  Diet advanced to Dysphagia 3. Per MD note difficulty controlling Afib, cardiology consult pending.  Potassium low.  Pt agitated pt ate well yesterday but not much breakfast this morning per RN.   Height: Ht Readings from Last 1 Encounters:  12/13/13 4\' 10"  (1.473 m)    Weight Status:   Wt Readings from Last 1 Encounters:  12/17/13 141 lb 5 oz (64.1 kg)  Admission weight 136 lb (63.4 kg)  Re-estimated needs:  Kcal: 1400-1600 Protein: 70-80 grams Fluid: >1.5 L/day  Skin: no issues noted  Diet Order: Dysphagia 3 with Thin Liquids Meal Completion: 100%   Intake/Output Summary (Last 24 hours) at 12/17/13 1011 Last data filed at 12/17/13 1000  Gross per 24 hour  Intake 1463.94 ml  Output    725 ml  Net 738.94 ml    Last BM: 1/4   Labs:   Recent Labs Lab 12/13/13 0820 12/14/13 0410 12/16/13 0535 12/17/13 0415  NA 150* 151* 136* 132*  K 3.3* 3.0* 2.6* 3.6*  CL 117* 119* 104 98  CO2 15* 18* 18* 18*  BUN 32* 29* 14 14  CREATININE 2.18* 1.64* 0.87 0.95  CALCIUM 6.6* 7.2* 7.1* 7.4*  MG 1.9  --   --  1.8  PHOS 4.5  --   --   --   GLUCOSE 87 75 102* 75    CBG (last 3)   Recent Labs  12/16/13 1734 12/16/13 2159 12/17/13 0828  GLUCAP 104* 108* 82    Scheduled Meds: . antiseptic oral rinse  15 mL Mouth Rinse BID  . atenolol  100 mg Oral Daily  . aztreonam  1 g Intravenous Q8H  . Chlorhexidine Gluconate Cloth  6 each Topical Q0600  . insulin aspart  0-15 Units Subcutaneous TID WC  . levofloxacin (LEVAQUIN) IV  750 mg Intravenous  Q48H  . mirtazapine  15 mg Oral QHS  . mupirocin ointment  1 application Nasal BID  . pantoprazole  40 mg Oral Q1200  . risperiDONE  1 mg Oral BID    Continuous Infusions: . sodium chloride 20 mL/hr at 12/17/13 1000  . amiodarone (NEXTERONE PREMIX) 360 mg/200 mL dextrose 30 mg/hr (12/17/13 1000)    Sibley, Hope, CNSC [phone] Pager [phone] After Hours Pager

## 2013-12-18 ENCOUNTER — Inpatient Hospital Stay (HOSPITAL_COMMUNITY): Payer: Medicare Other

## 2013-12-18 LAB — BASIC METABOLIC PANEL
BUN: 14 mg/dL (ref 6–23)
CO2: 17 mEq/L — ABNORMAL LOW (ref 19–32)
Calcium: 7.7 mg/dL — ABNORMAL LOW (ref 8.4–10.5)
Chloride: 106 mEq/L (ref 96–112)
Creatinine, Ser: 0.91 mg/dL (ref 0.50–1.35)
GFR, EST AFRICAN AMERICAN: 90 mL/min — AB (ref >90)
GFR, EST NON AFRICAN AMERICAN: 77 mL/min — AB (ref >90)
Glucose, Bld: 83 mg/dL (ref 70–99)
Potassium: 3.5 mEq/L — ABNORMAL LOW (ref 3.7–5.3)
SODIUM: 135 meq/L — AB (ref 137–147)

## 2013-12-18 LAB — CBC
HCT: 33.2 % — ABNORMAL LOW (ref 39.0–52.0)
Hemoglobin: 10.4 g/dL — ABNORMAL LOW (ref 13.0–17.0)
MCH: 25.9 pg — ABNORMAL LOW (ref 26.0–34.0)
MCHC: 31.3 g/dL (ref 30.0–36.0)
MCV: 82.8 fL (ref 78.0–100.0)
PLATELETS: 286 10*3/uL (ref 150–400)
RBC: 4.01 MIL/uL — AB (ref 4.22–5.81)
RDW: 21.4 % — ABNORMAL HIGH (ref 11.5–15.5)
WBC: 7.7 10*3/uL (ref 4.0–10.5)

## 2013-12-18 LAB — GLUCOSE, CAPILLARY
GLUCOSE-CAPILLARY: 115 mg/dL — AB (ref 70–99)
GLUCOSE-CAPILLARY: 49 mg/dL — AB (ref 70–99)
GLUCOSE-CAPILLARY: 76 mg/dL (ref 70–99)
Glucose-Capillary: 216 mg/dL — ABNORMAL HIGH (ref 70–99)
Glucose-Capillary: 48 mg/dL — ABNORMAL LOW (ref 70–99)
Glucose-Capillary: 152 mg/dL — ABNORMAL HIGH (ref 70–99)

## 2013-12-18 LAB — PROTIME-INR
INR: 3.2 — AB (ref 0.00–1.49)
PROTHROMBIN TIME: 31.6 s — AB (ref 11.6–15.2)

## 2013-12-18 MED ORDER — POTASSIUM CHLORIDE CRYS ER 20 MEQ PO TBCR
40.0000 meq | EXTENDED_RELEASE_TABLET | ORAL | Status: AC
  Administered 2013-12-18 (×2): 40 meq via ORAL
  Filled 2013-12-18 (×2): qty 2

## 2013-12-18 MED ORDER — FUROSEMIDE 40 MG PO TABS
40.0000 mg | ORAL_TABLET | Freq: Once | ORAL | Status: AC
  Administered 2013-12-18: 40 mg via ORAL
  Filled 2013-12-18: qty 1

## 2013-12-18 NOTE — Progress Notes (Signed)
Took patients blood sugar on the right hand and the glucometer read 48. Retook blood sugar and the glucometer read 49. The patient has been eating and has not had low blood sugar throughout his stay in the hospital. Replace glucometer and took blood sugar from the left hand which read 115. Will continue to monitor.

## 2013-12-18 NOTE — Progress Notes (Signed)
Name: Francis Dodson MRN: [id number] DOB: July 31, 1930    ADMISSION DATE:  12/13/2013  REFERRING MD :  Lovie Macadamia  CHIEF COMPLAINT:  Short of breath  BRIEF PATIENT DESCRIPTION:  80 yo male from SNF went to [facility] ER with difficulty breathing, and found to have PNA.  Intubated, and transferred to [facility] for further treatment.  SIGNIFICANT EVENTS: 1/02 Transfer to [facility] from [facility] 1/04 A fib, low BP >> amiodarone, phenylephrine; agitated >> haldol 1/06 Cardiology consulted, transition to po amiodarone, add eliquis  STUDIES:   LINES / TUBES: ETT 1/02 >>1/2 Lt IJ CVL 1/02 >>1/3 (pt pulled) R IJ CVC 1/4 >> 1/6 (pt pulled)  CULTURES: Blood 1/02 ([place]) >> Sputum 1/02 >> Pseudomonas Influenza PCR 1/02 >>negative Pneumococcal Ag 1/02 >>negative Legionella Ag 1/02 >> negaitive C diff 1/03 >> negative  ANTIBIOTICS: Vancomycin 1/02 >> 1/5 Aztreonam 1/02 >> 1/7 Levaquin 1/02 >> Tamiflu 1/02 >> 1/3  SUBJECTIVE:  Pulled foley out.  HR better.  More oriented this AM.  VITAL SIGNS: Temp:  [97.4 F (36.3 C)-99.8 F (37.7 C)] 97.5 F (36.4 C) (01/07 0800) Pulse Rate:  [44-116] 89 (01/07 0800) Resp:  [16-29] 18 (01/07 0800) BP: (73-152)/(49-109) 101/54 mmHg (01/07 0800) SpO2:  [80 %-100 %] 100 % (01/07 0800) Weight:  [143 lb 15.4 oz (65.3 kg)] 143 lb 15.4 oz (65.3 kg) (01/07 0500) Room air  INTAKE / OUTPUT: Intake/Output     01/06 0701 - 01/07 0700 01/07 0701 - 01/08 0700   P.O. 660    I.V. (mL/kg) 563.5 (8.6) 20 (0.3)   IV Piggyback 300    Total Intake(mL/kg) 1523.5 (23.3) 20 (0.3)   Urine (mL/kg/hr) 50 (0)    Total Output 50     Net +1473.5 +20        Urine Occurrence 7 x 1 x   Stool Occurrence 2 x      PHYSICAL EXAMINATION: General: No distress Neuro: follows simple commands, moves all extremities HEENT:  Pupils reactive, edentulous Cardiovascular:  irregular Lungs:  Scattered rhonchi, basilar rales Abdomen:  Soft, non tender Musculoskeletal:  No  edema Skin:  No rashes  LABS:  CBC  Recent Labs Lab 12/14/13 0410 12/17/13 0415 12/18/13 0240  WBC 16.9* 9.5 7.7  HGB 10.8* 10.7* 10.4*  HCT 35.5* 33.4* 33.2*  PLT 387 336 286   Coag's  Recent Labs Lab 12/13/13 0820  12/16/13 0535 12/17/13 0415 12/18/13 0240  APTT 50*  --   --   --   --   INR 3.24*  < > 2.54* 2.45* 3.20*  < > = values in this interval not displayed. BMET  Recent Labs Lab 12/16/13 0535 12/17/13 0415 12/18/13 0240  NA 136* 132* 135*  K 2.6* 3.6* 3.5*  CL 104 98 106  CO2 18* 18* 17*  BUN 14 14 14   CREATININE 0.87 0.95 0.91  GLUCOSE 102* 75 83   Electrolytes  Recent Labs Lab 12/13/13 0820  12/16/13 0535 12/17/13 0415 12/18/13 0240  CALCIUM 6.6*  < > 7.1* 7.4* 7.7*  MG 1.9  --   --  1.8  --   PHOS 4.5  --   --   --   --   < > = values in this interval not displayed. Sepsis Markers  Recent Labs Lab 12/13/13 0820  LATICACIDVEN 2.2  PROCALCITON 0.39   Liver Enzymes  Recent Labs Lab 12/13/13 0820  AST 24  ALT 11  ALKPHOS 154*  BILITOT 0.4  ALBUMIN 1.7*  Cardiac Enzymes  Recent Labs Lab 12/13/13 0820 12/13/13 1310 12/13/13 2200  TROPONINI <0.30 <0.30 <0.30  PROBNP 7829.0*  --   --    Glucose  Recent Labs Lab 12/16/13 2159 12/17/13 0828 12/17/13 1217 12/17/13 1659 12/17/13 2154 12/18/13 0812  GLUCAP 108* 82 89 88 127* 216*    Imaging Dg Chest Port 1 View  12/18/2013   CLINICAL DATA:  Evaluate pneumonia.  EXAM: PORTABLE CHEST - 1 VIEW  COMPARISON:  12/15/2013  FINDINGS: Central venous catheter is been removed. No evidence for a pneumothorax. There is biapical lung scarring, right side greater the left. There are increased chest densities, particularly at the lung bases. Findings are suggestive for pleural fluid and suspect pulmonary edema. Patient is also rotated towards the left on this examination and difficult to evaluate the cardiac silhouette size. Thoracic aorta is heavily calcified. Again noted are old  right rib fractures.  IMPRESSION: Increased densities throughout both sides of the chest suggest increased pleural fluid with airspace disease or edema.  Removal of central line.   Electronically Signed   By: Markus Daft M.D.   On: 12/18/2013 08:15     ASSESSMENT / PLAN:  PULMONARY A: Acute respiratory failure 2nd to PNA. P:   -f/u CXR intermittently -oxygen as needed to keep SpO2 > 92% -BD's prn  CARDIOVASCULAR A:  Hypotension, Sepsis; recurrent 1/04 after back in A Fib >> off pressors 1/04. Hx of A fib chronic coumadin >> family concerned about difficulty regulating coumadin as outpt; transitioned to eliquis 1/6.Marland Kitchen Hx of HTN, hyperlipidemia, chronic diastolic dysfx. P:  -continue amiodarone per cardiology -continue eliquis in place of coumadin -continue tenormin  RENAL A:   Acute kidney injury >> resolved. Hypokalemia >> improved. Acidosis. Hx of prostate cancer. Hypernatremia >> resolved. Hypervolemia. P:   -f/u and replace electrolytes as needed -monitor renal fx, urine outpt -place condom cath -lasix 40 mg po x one on 1/7 -KCL 40 meq po q4h x 2 doses on 1/7  GASTROINTESTINAL A:   Severe protein calorie malnutrition. P:   -D3 diet -protonix for SUP  HEMATOLOGIC A:   Anemia. P:  -f/u CBC  INFECTIOUS A:   PNA in SNF resident >> Pseudomonas in sputum.  Allergy to PCN. P:   -d/c aztreonam 1/7 -day 6 levaquin  ENDOCRINE A:   Hx of DM type II.   P:   -SSI  NEUROLOGIC A:   Acute encephalopathy with agitated delirium. Hx of mild dementia. P:   -added risperdal 1/6 >> continue for now -continue remeron -prn xanax  Summary: Transfer to telemetry.  Keep on PCCM service for now.  Updated family at bedside.  Chesley Mires, MD [facility] Pulmonary/Critical Care 12/18/2013, 8:50 AM Pager:  [phone] After 3pm call: [phone]

## 2013-12-18 NOTE — Clinical Social Work Note (Signed)
Clinical Social Worker continuing to follow patient and family for support and discharge planning needs.  CSW spoke with patient daughter in law at the bedside who states that patient family has many dynamics.  She states that, patient wife left almost 40 years ago due to alcoholism and left patient to raise all 5 children on his own.  Patient son, Francis Dodson is the primary caregiver to patient.  Patient other children have been arrested for stealing from patient.  Patient daughter has called the unit requesting information, however she does not have the password.  Patient daughter in law states that they are not purposely keeping information from patient daughter, but her involvement is limited in patient care.  CSW explained to [employer] secretary that if patient daughter were to call back, issues need to be handled within the family unit.  Per patient daughter in law, the family is hopeful for patient return to [facility] at discharge.  CSW has contacted facility regarding patient return.  Admissions coordinator not available at time of call - CSW to follow up when patient is medically ready for discharge.  CSW remains available for support and to facilitate patient discharge needs.  Francis Dodson, [place]

## 2013-12-18 NOTE — Progress Notes (Signed)
Pt received as transfer in bed. Placed on telemetry monitor and VS done. Pt was inc of large amt of loose/soft BM. Pt has a Actuary for safety. Bed alarm placed on and in camera room.

## 2013-12-18 NOTE — Progress Notes (Addendum)
Patient Name: Francis Dodson Date of Encounter: 12/18/2013     Active Problems:   Respiratory failure, acute   Pseudomonal pneumonia   Acute delirium   Atrial fibrillation with RVR    SUBJECTIVE  Overall the patient is improved.  He is oxygenating at 100% on room air.  He is in no respiratory distress.  Rhythm remains permanent atrial fibrillation with controlled ventricular response.  CURRENT MEDS . amiodarone  200 mg Oral BID  . antiseptic oral rinse  15 mL Mouth Rinse BID  . apixaban  5 mg Oral BID  . atenolol  100 mg Oral Daily  . feeding supplement (ENSURE COMPLETE)  237 mL Oral BID BM  . insulin aspart  0-15 Units Subcutaneous TID WC  . levofloxacin (LEVAQUIN) IV  750 mg Intravenous Q48H  . mirtazapine  15 mg Oral QHS  . mupirocin ointment  1 application Nasal BID  . pantoprazole  40 mg Oral Q1200  . potassium chloride  40 mEq Oral Q4H  . risperiDONE  1 mg Oral BID    OBJECTIVE  Filed Vitals:   12/18/13 1007 12/18/13 1100 12/18/13 1157 12/18/13 1200  BP: 90/53 94/62  99/55  Pulse: 92 89  109  Temp:   97.3 F (36.3 C)   TempSrc:   Axillary   Resp: 16 26  26   Height:      Weight:      SpO2: 100% 100%  100%    Intake/Output Summary (Last 24 hours) at 12/18/13 1236 Last data filed at 12/18/13 1200  Gross per 24 hour  Intake   1300 ml  Output    425 ml  Net    875 ml   Filed Weights   12/16/13 0300 12/17/13 0500 12/18/13 0500  Weight: 136 lb 3.9 oz (61.8 kg) 141 lb 5 oz (64.1 kg) 143 lb 15.4 oz (65.3 kg)    PHYSICAL EXAM  General: Sleepy at present. Neuro: Not tested Psych: Not tested HEENT:  Normal  Neck: Supple without bruits or JVD. Lungs:  Bilateral rales and wheezes Heart: Irregularly irregular pulse.  No murmur gallop or rub Abdomen: Soft, non-tender, non-distended, BS + x 4.  Extremities: Mild ankle edema bilaterally worse on the right Accessory Clinical Findings  CBC  Recent Labs  12/17/13 0415 12/18/13 0240  WBC 9.5 7.7  HGB  10.7* 10.4*  HCT 33.4* 33.2*  MCV 83.1 82.8  PLT 336 [id number]   Basic Metabolic Panel  Recent Labs  12/17/13 0415 12/18/13 0240  NA 132* 135*  K 3.6* 3.5*  CL 98 106  CO2 18* 17*  GLUCOSE 75 83  BUN 14 14  CREATININE 0.95 0.91  CALCIUM 7.4* 7.7*  MG 1.8  --    Liver Function Tests No results found for this basename: AST, ALT, ALKPHOS, BILITOT, PROT, ALBUMIN,  in the last 72 hours No results found for this basename: LIPASE, AMYLASE,  in the last 72 hours Cardiac Enzymes No results found for this basename: CKTOTAL, CKMB, CKMBINDEX, TROPONINI,  in the last 72 hours BNP No components found with this basename: POCBNP,  D-Dimer No results found for this basename: DDIMER,  in the last 72 hours Hemoglobin A1C No results found for this basename: HGBA1C,  in the last 72 hours Fasting Lipid Panel No results found for this basename: CHOL, HDL, LDLCALC, TRIG, CHOLHDL, LDLDIRECT,  in the last 72 hours Thyroid Function Tests No results found for this basename: TSH, T4TOTAL, FREET3, T3FREE, THYROIDAB,  in the  last 72 hours  TELE  Atrial fibrillation with controlled ventricular response  ECG    Radiology/Studies  Dg Chest Port 1 View  12/18/2013   CLINICAL DATA:  Evaluate pneumonia.  EXAM: PORTABLE CHEST - 1 VIEW  COMPARISON:  12/15/2013  FINDINGS: Central venous catheter is been removed. No evidence for a pneumothorax. There is biapical lung scarring, right side greater the left. There are increased chest densities, particularly at the lung bases. Findings are suggestive for pleural fluid and suspect pulmonary edema. Patient is also rotated towards the left on this examination and difficult to evaluate the cardiac silhouette size. Thoracic aorta is heavily calcified. Again noted are old right rib fractures.  IMPRESSION: Increased densities throughout both sides of the chest suggest increased pleural fluid with airspace disease or edema.  Removal of central line.   Electronically Signed    By: Markus Daft M.D.   On: 12/18/2013 08:15   Dg Chest Port 1 View  12/15/2013   CLINICAL DATA:  CVL placement  EXAM: PORTABLE CHEST - 1 VIEW  COMPARISON:  Chest x-ray from yesterday  FINDINGS: New right IJ central venous catheter, tip at the level of the distal SVC. No evidence of pneumothorax.  Stable heart size and mediastinal contours. There is improved aeration at the left base, although the retrocardiac opacity still dense. Haziness of the lower chest which may represent layering pleural fluid. Pulmonary edema.  IMPRESSION: 1. New right IJ catheter without adverse feature. 2. Stable bilateral lung opacities with probable layering pleural fluid. 3. Pulmonary venous congestion bordering on edema.   Electronically Signed   By: Jorje Guild M.D.   On: 12/15/2013 03:48   Dg Chest Port 1 View  12/14/2013   CLINICAL DATA:  Pneumonia.  EXAM: PORTABLE CHEST - 1 VIEW  COMPARISON:  12/13/2013  FINDINGS: The central line and endotracheal tube have been removed. There is improved aeration at the right lung base. Persistent densities at the left lung base suggest pleural fluid and atelectasis/airspace disease. There are old right rib fractures. Heart size is within normal limits and stable. Thoracic aorta is heavily calcified. No evidence for a pneumothorax.  IMPRESSION: Decreasing airspace disease at the right lung base.  Stable left basilar densities.  Removal of support apparatuses.   Electronically Signed   By: Markus Daft M.D.   On: 12/14/2013 08:16   Dg Chest Port 1 View  12/13/2013   CLINICAL DATA:  Assess central line placement.  EXAM: PORTABLE CHEST - 1 VIEW  COMPARISON:  12/13/2013 at 3:23 a.m.  FINDINGS: Endotracheal tube has tip 4.7 cm above the carinal. There has been placement of a left IJ central venous catheterization with tip over the SVC.  The lungs are adequately inflated with mild worsening of a bibasilar process right worse than left likely due to infection with associated small  effusions/atelectasis. There is no evidence of pneumothorax. Cardiomediastinal silhouette and remainder of the exam is unchanged.  IMPRESSION: Worsening bibasilar process suggesting infection likely with associated small effusions/atelectasis.  Tubes and lines as described.   Electronically Signed   By: Marin Olp M.D.   On: 12/13/2013 07:57   Dg Abd Portable 1v  12/13/2013   CLINICAL DATA:  OG tube placement.  EXAM: PORTABLE ABDOMEN - 1 VIEW  COMPARISON:  None.  FINDINGS: NG tube tip is in the proximal to mid stomach. The side port is near the GE junction.  Nonobstructive bowel gas pattern. No supine evidence of free air. Surgical clips in the  upper abdomen near the GE junction.  IMPRESSION: NG tube tip in the proximal to mid stomach.   Electronically Signed   By: Rolm Baptise M.D.   On: 12/13/2013 10:37    ASSESSMENT AND PLAN 1. Atrial fibrillation. Earlier heart rate was difficult to control related to his acute pneumonia and hypotension. Now blood pressure has improved and heart rate is currently under good control. Atenolol is resumed.  Now on oral  amiodarone at 200 mg twice a day. I would anticipate that with resolution of his acute illness he will be able to be discharged on beta blocker therapy only.  2. Anticoagulation. He has a [place] vascular score of 4 placing him at high risk of CVA. There is no current indication for anticoagulation for history of thromboembolism. Given the difficulty regulating his Coumadin I would recommend switching him to Eliquis.  I spoke with his daughter today who is very pleased that he will not be on Coumadin any longer since his Coumadin control was so erratic previously.  3. History of diastolic heart failure. . Echocardiogram previously was unremarkable.  4. Pneumonia improving  5. Acute kidney failure-resolved.  6. History of hypertension  7. ICU delirium, resolved    Signed, Darlin Coco MD

## 2013-12-19 LAB — GLUCOSE, CAPILLARY
GLUCOSE-CAPILLARY: 135 mg/dL — AB (ref 70–99)
GLUCOSE-CAPILLARY: 111 mg/dL — AB (ref 70–99)
Glucose-Capillary: 155 mg/dL — ABNORMAL HIGH (ref 70–99)
Glucose-Capillary: 46 mg/dL — ABNORMAL LOW (ref 70–99)
Glucose-Capillary: 91 mg/dL (ref 70–99)

## 2013-12-19 LAB — BASIC METABOLIC PANEL
BUN: 12 mg/dL (ref 6–23)
CALCIUM: 7.8 mg/dL — AB (ref 8.4–10.5)
CO2: 16 mEq/L — ABNORMAL LOW (ref 19–32)
Chloride: 112 mEq/L (ref 96–112)
Creatinine, Ser: 0.88 mg/dL (ref 0.50–1.35)
GFR calc Af Amer: >90 mL/min (ref >90)
GFR calc non Af Amer: 78 mL/min — ABNORMAL LOW (ref >90)
Glucose, Bld: 66 mg/dL — ABNORMAL LOW (ref 70–99)
Potassium: 4.8 mEq/L (ref 3.7–5.3)
SODIUM: 143 meq/L (ref 137–147)

## 2013-12-19 LAB — MAGNESIUM: MAGNESIUM: 2 mg/dL (ref 1.5–2.5)

## 2013-12-19 MED ORDER — DEXTROSE 50 % IV SOLN: 25.0000 mL | Freq: Once | INTRAVENOUS | Status: AC | PRN

## 2013-12-19 MED ORDER — FUROSEMIDE 40 MG PO TABS
40.0000 mg | ORAL_TABLET | Freq: Once | ORAL | Status: AC
  Administered 2013-12-19: 40 mg via ORAL
  Filled 2013-12-19: qty 1

## 2013-12-19 MED ORDER — DEXTROSE 50 % IV SOLN
INTRAVENOUS | Status: AC
  Administered 2013-12-19: 50 mL
  Filled 2013-12-19: qty 50

## 2013-12-19 MED ORDER — ATENOLOL 50 MG PO TABS
50.0000 mg | ORAL_TABLET | Freq: Two times a day (BID) | ORAL | Status: DC
  Administered 2013-12-19 – 2013-12-20 (×3): 50 mg via ORAL
  Filled 2013-12-19 (×4): qty 1

## 2013-12-19 NOTE — Progress Notes (Signed)
CNA reported that pt's blood sugar was 46. Pt arousable and able to drink one container of orange juice. Also gave 15 ml IV dextrose. 15 minutes later pt's blood sugar was 91. Pt awake in bed watching TV. Safety sitter at bedside. Will continue to monitor.   - Soyla Dryer, RN

## 2013-12-19 NOTE — Progress Notes (Signed)
Patient Name: Francis Dodson Date of Encounter: 12/19/2013     Active Problems:   Respiratory failure, acute   Pseudomonal pneumonia   Acute delirium   Atrial fibrillation with RVR    SUBJECTIVE  Today the patient is more alert.  He is in no acute distress.  Oxygenation remains normal on room air.  He is in permanent atrial fibrillation.  He is on apixaban now rather than Coumadin which was erratic in its previous control.  Blood pressure remains soft.  CURRENT MEDS . amiodarone  200 mg Oral BID  . antiseptic oral rinse  15 mL Mouth Rinse BID  . apixaban  5 mg Oral BID  . atenolol  100 mg Oral Daily  . feeding supplement (ENSURE COMPLETE)  237 mL Oral BID BM  . insulin aspart  0-15 Units Subcutaneous TID WC  . levofloxacin (LEVAQUIN) IV  750 mg Intravenous Q48H  . mirtazapine  15 mg Oral QHS  . pantoprazole  40 mg Oral Q1200  . risperiDONE  1 mg Oral BID    OBJECTIVE  Filed Vitals:   12/18/13 1655 12/18/13 1753 12/18/13 2147 12/19/13 0456  BP: 96/71 94/74  102/71  Pulse: 87 82 92 94  Temp:  97.3 F (36.3 C) 97.9 F (36.6 C) 97.6 F (36.4 C)  TempSrc:  Oral Axillary Oral  Resp: 16 18 19 18   Height:      Weight:    142 lb 6.7 oz (64.6 kg)  SpO2: 100% 94% 99% 97%    Intake/Output Summary (Last 24 hours) at 12/19/13 0907 Last data filed at 12/18/13 1906  Gross per 24 hour  Intake    740 ml  Output   1070 ml  Net   -330 ml   Filed Weights   12/17/13 0500 12/18/13 0500 12/19/13 0456  Weight: 141 lb 5 oz (64.1 kg) 143 lb 15.4 oz (65.3 kg) 142 lb 6.7 oz (64.6 kg)    PHYSICAL EXAM  General: Pleasant, NAD. Neuro: Marland Kitchen Moves all extremities spontaneously. Psych: Normal affect. HEENT:  Normal  Neck: Supple without bruits or JVD. Lungs:  Poor inspiratory effort.  Decreased breath sounds at bases. Heart:  no s3, s4, or murmurs.  Pulse rapid and irregular Abdomen: Soft, non-tender, non-distended, BS + x 4.  Extremities: No clubbing, cyanosis or edema.     Accessory Clinical Findings  CBC  Recent Labs  12/17/13 0415 12/18/13 0240  WBC 9.5 7.7  HGB 10.7* 10.4*  HCT 33.4* 33.2*  MCV 83.1 82.8  PLT 336 751   Basic Metabolic Panel  Recent Labs  12/17/13 0415 12/18/13 0240 12/19/13 0540  NA 132* 135* 143  K 3.6* 3.5* 4.8  CL 98 106 112  CO2 18* 17* 16*  GLUCOSE 75 83 66*  BUN 14 14 12   CREATININE 0.95 0.91 0.88  CALCIUM 7.4* 7.7* 7.8*  MG 1.8  --  2.0   Liver Function Tests No results found for this basename: AST, ALT, ALKPHOS, BILITOT, PROT, ALBUMIN,  in the last 72 hours No results found for this basename: LIPASE, AMYLASE,  in the last 72 hours Cardiac Enzymes No results found for this basename: CKTOTAL, CKMB, CKMBINDEX, TROPONINI,  in the last 72 hours BNP No components found with this basename: POCBNP,  D-Dimer No results found for this basename: DDIMER,  in the last 72 hours Hemoglobin A1C No results found for this basename: HGBA1C,  in the last 72 hours Fasting Lipid Panel No results found for this basename:  CHOL, HDL, LDLCALC, TRIG, CHOLHDL, LDLDIRECT,  in the last 72 hours Thyroid Function Tests No results found for this basename: TSH, T4TOTAL, FREET3, T3FREE, THYROIDAB,  in the last 72 hours  TELE  Atrial fibrillation with rapid ventricular response at times.  ECG    Radiology/Studies  Dg Chest Port 1 View  12/18/2013   CLINICAL DATA:  Evaluate pneumonia.  EXAM: PORTABLE CHEST - 1 VIEW  COMPARISON:  12/15/2013  FINDINGS: Central venous catheter is been removed. No evidence for a pneumothorax. There is biapical lung scarring, right side greater the left. There are increased chest densities, particularly at the lung bases. Findings are suggestive for pleural fluid and suspect pulmonary edema. Patient is also rotated towards the left on this examination and difficult to evaluate the cardiac silhouette size. Thoracic aorta is heavily calcified. Again noted are old right rib fractures.  IMPRESSION: Increased  densities throughout both sides of the chest suggest increased pleural fluid with airspace disease or edema.  Removal of central line.   Electronically Signed   By: Markus Daft M.D.   On: 12/18/2013 08:15   Dg Chest Port 1 View  12/15/2013   CLINICAL DATA:  CVL placement  EXAM: PORTABLE CHEST - 1 VIEW  COMPARISON:  Chest x-ray from yesterday  FINDINGS: New right IJ central venous catheter, tip at the level of the distal SVC. No evidence of pneumothorax.  Stable heart size and mediastinal contours. There is improved aeration at the left base, although the retrocardiac opacity still dense. Haziness of the lower chest which may represent layering pleural fluid. Pulmonary edema.  IMPRESSION: 1. New right IJ catheter without adverse feature. 2. Stable bilateral lung opacities with probable layering pleural fluid. 3. Pulmonary venous congestion bordering on edema.   Electronically Signed   By: Jorje Guild M.D.   On: 12/15/2013 03:48   Dg Chest Port 1 View  12/14/2013   CLINICAL DATA:  Pneumonia.  EXAM: PORTABLE CHEST - 1 VIEW  COMPARISON:  12/13/2013  FINDINGS: The central line and endotracheal tube have been removed. There is improved aeration at the right lung base. Persistent densities at the left lung base suggest pleural fluid and atelectasis/airspace disease. There are old right rib fractures. Heart size is within normal limits and stable. Thoracic aorta is heavily calcified. No evidence for a pneumothorax.  IMPRESSION: Decreasing airspace disease at the right lung base.  Stable left basilar densities.  Removal of support apparatuses.   Electronically Signed   By: Markus Daft M.D.   On: 12/14/2013 08:16   Dg Chest Port 1 View  12/13/2013   CLINICAL DATA:  Assess central line placement.  EXAM: PORTABLE CHEST - 1 VIEW  COMPARISON:  12/13/2013 at 3:23 a.m.  FINDINGS: Endotracheal tube has tip 4.7 cm above the carinal. There has been placement of a left IJ central venous catheterization with tip over the SVC.   The lungs are adequately inflated with mild worsening of a bibasilar process right worse than left likely due to infection with associated small effusions/atelectasis. There is no evidence of pneumothorax. Cardiomediastinal silhouette and remainder of the exam is unchanged.  IMPRESSION: Worsening bibasilar process suggesting infection likely with associated small effusions/atelectasis.  Tubes and lines as described.   Electronically Signed   By: Marin Olp M.D.   On: 12/13/2013 07:57   Dg Abd Portable 1v  12/13/2013   CLINICAL DATA:  OG tube placement.  EXAM: PORTABLE ABDOMEN - 1 VIEW  COMPARISON:  None.  FINDINGS: NG tube  tip is in the proximal to mid stomach. The side port is near the GE junction.  Nonobstructive bowel gas pattern. No supine evidence of free air. Surgical clips in the upper abdomen near the GE junction.  IMPRESSION: NG tube tip in the proximal to mid stomach.   Electronically Signed   By: Rolm Baptise M.D.   On: 12/13/2013 10:37    ASSESSMENT AND PLAN 1. Atrial fibrillation.  On atenolol and amiodarone 2. Anticoagulation. He has a [place] vascular score of 4.  Now on Eliquis. 3. History of diastolic heart failure. . Echocardiogram previously was unremarkable.  Chest x-ray still shows some evidence of fluid overload.  4. Pneumonia improving  5. Acute kidney failure-BUN and creatinine now back to normal 6. History of hypertension  7. ICU delirium, resolved  Plan: We will give Lasix once today. Blood pressure still soft and will change timing of atenolol. Possible discharge back to [facility] skilled nursing facility in the next day or 2 if stable  Signed, Darlin Coco MD

## 2013-12-19 NOTE — Progress Notes (Signed)
PT Cancellation Note  Patient Details Name: Francis Dodson MRN: [id number] DOB: [birth year]   Cancelled Treatment:    Reason Eval/Treat Not Completed: Medical issues which prohibited therapy.  RN reports pt has been very restless today and they have just recently gotten him settled in the bed and resting.  She recommended coming back later today or tomorrow.     Barbarann Ehlers [place], [place], DPT [phone]   12/19/2013, 3:20 PM

## 2013-12-19 NOTE — Clinical Social Work Placement (Signed)
Clinical Social Work Department  CLINICAL SOCIAL WORK PLACEMENT NOTE   Patient: AURELIANO OSHIELDS  Account Number: [id number]  Admit date:  12/13/12 Clinical Social Worker: Rhea Pink LCSWA Date/time: 12/19/2013 11:30 AM  Clinical Social Work is seeking post-discharge placement for this patient at the following level of care: SKILLED NURSING (*CSW will update this form in Epic as items are completed)  12/19/2013 Patient/family provided with [place] Department of Clinical Social Work's list of facilities offering this level of care within the geographic area requested by the patient (or if unable, by the patient's family).  12/19/2013 Patient/family informed of their freedom to choose among providers that offer the needed level of care, that participate in Medicare, Medicaid or managed care program needed by the patient, have an available bed and are willing to accept the patient.  1/8/2015Patient/family informed of [facility]' ownership interest in [facility], as well as of the fact that they are under no obligation to receive care at this facility.  PASARR submitted to EDS on  PASARR number received from [place] on   FL2 transmitted to all facilities in geographic area requested by pt/family on 12/19/2013 FL2 transmitted to all facilities within larger geographic area on  Patient informed that his/her managed care company has contracts with or will negotiate with certain facilities, including the following:  Patient/family informed of bed offers received:  Patient chooses bed at [facility] [place] Physician recommends and patient chooses bed at  Patient to be transferred to on  Patient to be transferred to facility by  The following physician request were entered in Epic:  Additional Comments:

## 2013-12-19 NOTE — Progress Notes (Signed)
[employer] PCCM    Name: Francis Dodson MRN: [id number] DOB: 1930/08/27    ADMISSION DATE:  12/13/2013  REFERRING MD :  Lovie Macadamia  CHIEF COMPLAINT:  Short of breath  BRIEF PATIENT DESCRIPTION:  80 yo male from SNF went to [facility] ER with difficulty breathing, and found to have PNA.  Intubated, and transferred to [facility] for further treatment.  SIGNIFICANT EVENTS: 1/02 Transfer to [facility] from [facility] 1/04 A fib, low BP >> amiodarone, phenylephrine; agitated >> haldol 1/06 Cardiology consulted, transition to po amiodarone, add eliquis  STUDIES:   LINES / TUBES: ETT 1/02 >>1/2 Lt IJ CVL 1/02 >>1/3 (pt pulled) R IJ CVC 1/4 >> 1/6 (pt pulled)  CULTURES: Blood 1/02 ([place]) >> NEG (called 1/8)  Sputum 1/02 >> Pseudomonas Influenza PCR 1/02 >>negative Pneumococcal Ag 1/02 >>negative Legionella Ag 1/02 >> negative C diff 1/03 >> negative  ANTIBIOTICS: Vancomycin 1/02 >> 1/5 Aztreonam 1/02 >> 1/7 Levaquin 1/02 >> Tamiflu 1/02 >> 1/3  SUBJECTIVE:  Slightly more oriented this am per son.  No c/o per pt.    VITAL SIGNS: Temp:  [97.3 F (36.3 C)-97.9 F (36.6 C)] 97.6 F (36.4 C) (01/08 0456) Pulse Rate:  [82-109] 94 (01/08 0456) Resp:  [16-26] 18 (01/08 0456) BP: (85-102)/(42-74) 102/71 mmHg (01/08 0456) SpO2:  [94 %-100 %] 97 % (01/08 0456) Weight:  [142 lb 6.7 oz (64.6 kg)] 142 lb 6.7 oz (64.6 kg) (01/08 0456) Room air  INTAKE / OUTPUT: Intake/Output     01/07 0701 - 01/08 0700 01/08 0701 - 01/09 0700   P.O. 600 360   I.V. (mL/kg) 180 (2.8)    IV Piggyback     Total Intake(mL/kg) 780 (12.1) 360 (5.6)   Urine (mL/kg/hr) 1070 (0.7)    Total Output 1070     Net -290 +360        Urine Occurrence 2 x    Stool Occurrence 2 x      PHYSICAL EXAMINATION: General: No distress Neuro: awake, alert, slightly confused at times but follows commands and answers some questions appropriately, gen weakness, worse LLE (post polio)  HEENT:  Pupils reactive,  edentulous Cardiovascular:  irregular Lungs: resps even non labored, few scattered rhonchi Abdomen:  Soft, non tender Musculoskeletal:  No edema Skin:  No rashes  LABS:  CBC  Recent Labs Lab 12/14/13 0410 12/17/13 0415 12/18/13 0240  WBC 16.9* 9.5 7.7  HGB 10.8* 10.7* 10.4*  HCT 35.5* 33.4* 33.2*  PLT 387 336 286   Coag's  Recent Labs Lab 12/13/13 0820  12/16/13 0535 12/17/13 0415 12/18/13 0240  APTT 50*  --   --   --   --   INR 3.24*  < > 2.54* 2.45* 3.20*  < > = values in this interval not displayed. BMET  Recent Labs Lab 12/17/13 0415 12/18/13 0240 12/19/13 0540  NA 132* 135* 143  K 3.6* 3.5* 4.8  CL 98 106 112  CO2 18* 17* 16*  BUN 14 14 12   CREATININE 0.95 0.91 0.88  GLUCOSE 75 83 66*   Electrolytes  Recent Labs Lab 12/13/13 0820  12/17/13 0415 12/18/13 0240 12/19/13 0540  CALCIUM 6.6*  < > 7.4* 7.7* 7.8*  MG 1.9  --  1.8  --  2.0  PHOS 4.5  --   --   --   --   < > = values in this interval not displayed. Sepsis Markers  Recent Labs Lab 12/13/13 0820  LATICACIDVEN 2.2  PROCALCITON 0.39   Liver  Enzymes  Recent Labs Lab 12/13/13 0820  AST 24  ALT 11  ALKPHOS 154*  BILITOT 0.4  ALBUMIN 1.7*   Cardiac Enzymes  Recent Labs Lab 12/13/13 0820 12/13/13 1310 12/13/13 2200  TROPONINI <0.30 <0.30 <0.30  PROBNP 7829.0*  --   --    Glucose  Recent Labs Lab 12/18/13 1142 12/18/13 1144 12/18/13 1148 12/18/13 1645 12/18/13 2309 12/19/13 0754  GLUCAP 48* 49* 115* 76 152* 155*    Imaging Dg Chest Port 1 View  12/18/2013   CLINICAL DATA:  Evaluate pneumonia.  EXAM: PORTABLE CHEST - 1 VIEW  COMPARISON:  12/15/2013  FINDINGS: Central venous catheter is been removed. No evidence for a pneumothorax. There is biapical lung scarring, right side greater the left. There are increased chest densities, particularly at the lung bases. Findings are suggestive for pleural fluid and suspect pulmonary edema. Patient is also rotated towards the  left on this examination and difficult to evaluate the cardiac silhouette size. Thoracic aorta is heavily calcified. Again noted are old right rib fractures.  IMPRESSION: Increased densities throughout both sides of the chest suggest increased pleural fluid with airspace disease or edema.  Removal of central line.   Electronically Signed   By: Markus Daft M.D.   On: 12/18/2013 08:15     ASSESSMENT / PLAN:  PULMONARY A: Acute respiratory failure 2nd to PNA. - much improved P:   -f/u CXR intermittently -oxygen as needed to keep SpO2 > 92% -BD's prn  CARDIOVASCULAR A:  Hypotension, Sepsis; Resolved.  Hx of A fib chronic coumadin >> family concerned about difficulty regulating coumadin as outpt; transitioned to eliquis 1/6. Hx of HTN, hyperlipidemia, chronic diastolic dysfx. P:  -continue amiodarone, tenormin per cardiology -continue eliquis in place of coumadin -gentle diuresis per cards   RENAL A:   Acute kidney injury >> resolved. Hypokalemia >> improved. Acidosis. Hx of prostate cancer. Hypernatremia >> resolved. Hypervolemia. P:   -f/u and replace electrolytes as needed -monitor renal fx, urine outpt -lasix per cards as above   GASTROINTESTINAL A:   Severe protein calorie malnutrition. P:   -D3 diet -protonix for SUP  HEMATOLOGIC A:   Anemia. P:  -f/u CBC  INFECTIOUS A:   PNA in SNF resident >> Pseudomonas in sputum.  Allergy to PCN. P:   - will d/c levaquin after 1/8 dose for total 7 days - f/u CBC   ENDOCRINE A:   Hx of DM type II.   P:   -SSI  NEUROLOGIC A:   Acute encephalopathy with agitated delirium. Hx of mild dementia. P:   -added risperdal 1/6 >> continue for now -continue remeron -prn xanax  Summary: PT/OT consults  Likely ready for DC back to [facility] 1/9  [facility], NP 12/19/2013  10:51 AM Pager: [phone] or [phone]  *Care during the described time interval was provided by me and/or other providers  on the critical care team. I have reviewed this patient's available data, including medical history, events of note, physical examination and test results as part of my evaluation.  Mariel Sleet Beeper  [phone]  Cell  [phone]  If no response or cell goes to voicemail, call beeper [phone]

## 2013-12-19 NOTE — Care Management Note (Signed)
    Page 1 of 1   12/19/2013     2:17:29 PM   CARE MANAGEMENT NOTE 12/19/2013  Patient:  Francis Dodson, Francis Dodson   Account Number:  [id number]  Date Initiated:  12/19/2013  Documentation initiated by:  GRAVES-BIGELOW,Ariann Khaimov  Subjective/Objective Assessment:   Pt admitted from [facility] for SOB. Pt is from the [facility]. CSW will assist with disposition needs.     Action/Plan:   Per MD notes plan for d/c 12-20-13.   Anticipated DC Date:  12/20/2013   Anticipated DC Plan:  SKILLED NURSING FACILITY  In-house referral  Clinical Social Worker      DC Planning Services  CM consult      Choice offered to / List presented to:             Status of service:  Completed, signed off Medicare Important Message given?   (If response is "NO", the following Medicare IM given date fields will be blank) Date Medicare IM given:   Date Additional Medicare IM given:    Discharge Disposition:  [place]  Per UR Regulation:  Reviewed for med. necessity/level of care/duration of stay  If discussed at [place] of Stay Meetings, dates discussed:    Comments:

## 2013-12-19 NOTE — Progress Notes (Signed)
The Chaplain stopped by for an initial visit to visit with the patient but the patient was resting. A follow up visit with this patient will be carried out by a Chaplain.  Chaplain Clista Bernhardt Griffyn Kucinski

## 2013-12-20 ENCOUNTER — Inpatient Hospital Stay (HOSPITAL_COMMUNITY): Payer: Medicare Other

## 2013-12-20 LAB — BASIC METABOLIC PANEL
BUN: 9 mg/dL (ref 6–23)
CHLORIDE: 108 meq/L (ref 96–112)
CO2: 16 mEq/L — ABNORMAL LOW (ref 19–32)
Calcium: 7.3 mg/dL — ABNORMAL LOW (ref 8.4–10.5)
Creatinine, Ser: 0.77 mg/dL (ref 0.50–1.35)
GFR calc Af Amer: >90 mL/min (ref >90)
GFR, EST NON AFRICAN AMERICAN: 83 mL/min — AB (ref >90)
GLUCOSE: 80 mg/dL (ref 70–99)
POTASSIUM: 4.9 meq/L (ref 3.7–5.3)
SODIUM: 138 meq/L (ref 137–147)

## 2013-12-20 LAB — CBC
HCT: 30.3 % — ABNORMAL LOW (ref 39.0–52.0)
HEMOGLOBIN: 9.7 g/dL — AB (ref 13.0–17.0)
MCH: 26.8 pg (ref 26.0–34.0)
MCHC: 32 g/dL (ref 30.0–36.0)
MCV: 83.7 fL (ref 78.0–100.0)
Platelets: 288 10*3/uL (ref 150–400)
RBC: 3.62 MIL/uL — AB (ref 4.22–5.81)
RDW: 22.2 % — ABNORMAL HIGH (ref 11.5–15.5)
WBC: 10.8 10*3/uL — ABNORMAL HIGH (ref 4.0–10.5)

## 2013-12-20 LAB — GLUCOSE, CAPILLARY: GLUCOSE-CAPILLARY: 88 mg/dL (ref 70–99)

## 2013-12-20 MED ORDER — APIXABAN 5 MG PO TABS: 5.0000 mg | ORAL_TABLET | Freq: Two times a day (BID) | ORAL | Status: DC

## 2013-12-20 MED ORDER — FUROSEMIDE 40 MG PO TABS: 40.0000 mg | ORAL_TABLET | Freq: Every day | ORAL | Status: AC | PRN

## 2013-12-20 MED ORDER — ALBUTEROL SULFATE (2.5 MG/3ML) 0.083% IN NEBU: 2.5000 mg | INHALATION_SOLUTION | RESPIRATORY_TRACT | Status: AC | PRN

## 2013-12-20 MED ORDER — ENSURE COMPLETE PO LIQD: 237.0000 mL | Freq: Two times a day (BID) | ORAL | Status: DC

## 2013-12-20 MED ORDER — INSULIN ASPART 100 UNIT/ML ~~LOC~~ SOLN: 0.0000 [IU] | Freq: Three times a day (TID) | SUBCUTANEOUS | Status: DC

## 2013-12-20 MED ORDER — ATENOLOL 50 MG PO TABS: 50.0000 mg | ORAL_TABLET | Freq: Two times a day (BID) | ORAL | Status: AC

## 2013-12-20 MED ORDER — RISPERIDONE 1 MG PO TABS: 1.0000 mg | ORAL_TABLET | Freq: Two times a day (BID) | ORAL | Status: AC

## 2013-12-20 NOTE — Progress Notes (Signed)
Patient Name: Francis Dodson Date of Encounter: 12/20/2013     Active Problems:   Respiratory failure, acute   Pseudomonal pneumonia   Acute delirium   Atrial fibrillation with RVR    SUBJECTIVE The patient is in no respiratory distress on room air.  Remains chronically confused and disoriented.  Rhythm is atrial fibrillation with controlled ventricular response.  CURRENT MEDS . amiodarone  200 mg Oral BID  . antiseptic oral rinse  15 mL Mouth Rinse BID  . apixaban  5 mg Oral BID  . atenolol  50 mg Oral BID  . feeding supplement (ENSURE COMPLETE)  237 mL Oral BID BM  . insulin aspart  0-15 Units Subcutaneous TID WC  . mirtazapine  15 mg Oral QHS  . pantoprazole  40 mg Oral Q1200  . risperiDONE  1 mg Oral BID    OBJECTIVE  Filed Vitals:   12/19/13 0456 12/19/13 1334 12/19/13 2139 12/20/13 0620  BP: 102/71 96/57 102/68   Pulse: 94 73 81   Temp: 97.6 F (36.4 C) 97.7 F (36.5 C) 97.7 F (36.5 C)   TempSrc: Oral Oral Oral   Resp: 18 16 18    Height:      Weight: 142 lb 6.7 oz (64.6 kg)   144 lb 0.1 oz (65.321 kg)  SpO2: 97% 100% 100%     Intake/Output Summary (Last 24 hours) at 12/20/13 0811 Last data filed at 12/20/13 0600  Gross per 24 hour  Intake   1227 ml  Output    575 ml  Net    652 ml   Filed Weights   12/18/13 0500 12/19/13 0456 12/20/13 0620  Weight: 143 lb 15.4 oz (65.3 kg) 142 lb 6.7 oz (64.6 kg) 144 lb 0.1 oz (65.321 kg)    PHYSICAL EXAM  General: Pleasant, NAD.  Confused.  Does not know where he is. Neuro: Marland Kitchen Moves all extremities spontaneously. Psych: Normal affect. HEENT:  Normal  Neck: Supple without bruits or JVD. Lungs:  Poor inspiratory effort.  Distant breath sounds. Heart: Irregularly irregular rhythm. Abdomen: Soft, non-tender, non-distended, BS + x 4.  Extremities: No clubbing, cyanosis or edema. DP/PT/Radials 2+ and equal bilaterally.  Accessory Clinical Findings  CBC  Recent Labs  12/18/13 0240 12/20/13 0455  WBC 7.7  10.8*  HGB 10.4* 9.7*  HCT 33.2* 30.3*  MCV 82.8 83.7  PLT 286 [id number]   Basic Metabolic Panel  Recent Labs  12/19/13 0540 12/20/13 0455  NA 143 138  K 4.8 4.9  CL 112 108  CO2 16* 16*  GLUCOSE 66* 80  BUN 12 9  CREATININE 0.88 0.77  CALCIUM 7.8* 7.3*  MG 2.0  --    Liver Function Tests No results found for this basename: AST, ALT, ALKPHOS, BILITOT, PROT, ALBUMIN,  in the last 72 hours No results found for this basename: LIPASE, AMYLASE,  in the last 72 hours Cardiac Enzymes No results found for this basename: CKTOTAL, CKMB, CKMBINDEX, TROPONINI,  in the last 72 hours BNP No components found with this basename: POCBNP,  D-Dimer No results found for this basename: DDIMER,  in the last 72 hours Hemoglobin A1C No results found for this basename: HGBA1C,  in the last 72 hours Fasting Lipid Panel No results found for this basename: CHOL, HDL, LDLCALC, TRIG, CHOLHDL, LDLDIRECT,  in the last 72 hours Thyroid Function Tests No results found for this basename: TSH, T4TOTAL, FREET3, T3FREE, THYROIDAB,  in the last 72 hours  TELE  Atrial fibrillation  with controlled ventricular response.  ECG    Radiology/Studies  Dg Chest Portable 1 View  12/20/2013   CLINICAL DATA:  Pneumonia  EXAM: PORTABLE CHEST - 1 VIEW  COMPARISON:  DG CHEST 1V PORT dated 12/18/2013; DG CHEST 1V PORT dated 12/15/2013; DG CHEST 1V PORT dated 12/14/2013; DG CHEST 1V PORT dated 12/13/2013; DG CHEST 1V PORT SAME DAY dated 12/13/2013; DG CHEST 1V PORT dated 12/13/2013  FINDINGS: There are bilateral small pleural effusions. There is bibasilar airspace disease. There is no pneumothorax. Stable cardiomegaly. There are surgical clips in the epigastric region.  The osseous structures are unremarkable.  IMPRESSION: Bilateral small pleural effusions and basilar airspace disease which may reflect atelectasis versus multi lobar pneumonia. No significant interval change compared with 12/18/2013.   Electronically Signed   By: Kathreen Devoid    On: 12/20/2013 08:02   Dg Chest Port 1 View  12/18/2013   CLINICAL DATA:  Evaluate pneumonia.  EXAM: PORTABLE CHEST - 1 VIEW  COMPARISON:  12/15/2013  FINDINGS: Central venous catheter is been removed. No evidence for a pneumothorax. There is biapical lung scarring, right side greater the left. There are increased chest densities, particularly at the lung bases. Findings are suggestive for pleural fluid and suspect pulmonary edema. Patient is also rotated towards the left on this examination and difficult to evaluate the cardiac silhouette size. Thoracic aorta is heavily calcified. Again noted are old right rib fractures.  IMPRESSION: Increased densities throughout both sides of the chest suggest increased pleural fluid with airspace disease or edema.  Removal of central line.   Electronically Signed   By: Markus Daft M.D.   On: 12/18/2013 08:15   Dg Chest Port 1 View  12/15/2013   CLINICAL DATA:  CVL placement  EXAM: PORTABLE CHEST - 1 VIEW  COMPARISON:  Chest x-ray from yesterday  FINDINGS: New right IJ central venous catheter, tip at the level of the distal SVC. No evidence of pneumothorax.  Stable heart size and mediastinal contours. There is improved aeration at the left base, although the retrocardiac opacity still dense. Haziness of the lower chest which may represent layering pleural fluid. Pulmonary edema.  IMPRESSION: 1. New right IJ catheter without adverse feature. 2. Stable bilateral lung opacities with probable layering pleural fluid. 3. Pulmonary venous congestion bordering on edema.   Electronically Signed   By: Jorje Guild M.D.   On: 12/15/2013 03:48   Dg Chest Port 1 View  12/14/2013   CLINICAL DATA:  Pneumonia.  EXAM: PORTABLE CHEST - 1 VIEW  COMPARISON:  12/13/2013  FINDINGS: The central line and endotracheal tube have been removed. There is improved aeration at the right lung base. Persistent densities at the left lung base suggest pleural fluid and atelectasis/airspace disease. There  are old right rib fractures. Heart size is within normal limits and stable. Thoracic aorta is heavily calcified. No evidence for a pneumothorax.  IMPRESSION: Decreasing airspace disease at the right lung base.  Stable left basilar densities.  Removal of support apparatuses.   Electronically Signed   By: Markus Daft M.D.   On: 12/14/2013 08:16   Dg Chest Port 1 View  12/13/2013   CLINICAL DATA:  Assess central line placement.  EXAM: PORTABLE CHEST - 1 VIEW  COMPARISON:  12/13/2013 at 3:23 a.m.  FINDINGS: Endotracheal tube has tip 4.7 cm above the carinal. There has been placement of a left IJ central venous catheterization with tip over the SVC.  The lungs are adequately inflated with mild  worsening of a bibasilar process right worse than left likely due to infection with associated small effusions/atelectasis. There is no evidence of pneumothorax. Cardiomediastinal silhouette and remainder of the exam is unchanged.  IMPRESSION: Worsening bibasilar process suggesting infection likely with associated small effusions/atelectasis.  Tubes and lines as described.   Electronically Signed   By: Marin Olp M.D.   On: 12/13/2013 07:57   Dg Abd Portable 1v  12/13/2013   CLINICAL DATA:  OG tube placement.  EXAM: PORTABLE ABDOMEN - 1 VIEW  COMPARISON:  None.  FINDINGS: NG tube tip is in the proximal to mid stomach. The side port is near the GE junction.  Nonobstructive bowel gas pattern. No supine evidence of free air. Surgical clips in the upper abdomen near the GE junction.  IMPRESSION: NG tube tip in the proximal to mid stomach.   Electronically Signed   By: Rolm Baptise M.D.   On: 12/13/2013 10:37    ASSESSMENT AND PLAN 1. Atrial fibrillation.  Permanent atrial fibrillation.  I will stop amiodarone at this point.  Continue rate control with beta blocker 2. Anticoagulation. He has a [place] vascular score of 4. Now on Eliquis.  3. History of diastolic heart failure. . Echocardiogram previously was unremarkable.   4.  Pneumonia improving  5. Acute kidney failure-BUN and creatinine now back to normal  6. History of hypertension  7. Dementia 8. diabetes mellitus with occasional hypoglycemia  Plan: From Cardiac standpoint the patient is stable for transfer back to [facility] today.  Signed, Darlin Coco MD

## 2013-12-20 NOTE — Progress Notes (Signed)
Physical Therapy Treatment Patient Details Name: Francis Dodson MRN: [id number] DOB: 1930-09-02 Today's Date: 12/20/2013 Time: 7741-2878 PT Time Calculation (min): 33 min  PT Assessment / Plan / Recommendation  History of Present Illness Pt from SNF admitted for acute respiratory distress and PNA..   PT Comments   Patient is able to sit EOB to perform dynamic activities throughout session.  He fatigues after 15 minutes and needs to rest down on L elbow.  He is unable to achieve standing despite having +2 help.  Patient is pleasantly confused throughout session, and able to answer questions.  Patient will benefit from skilled PT in acute setting and at SNF to increase his function and safety with mobility.  Follow Up Recommendations  SNF           Equipment Recommendations  None recommended by PT       Frequency Min 2X/week   Progress towards PT Goals Progress towards PT goals: Progressing toward goals  Plan Current plan remains appropriate    Precautions / Restrictions Precautions Precautions: Fall Precaution Comments: Pt with L LE shorter than other, Restrictions Weight Bearing Restrictions: No   Pertinent Vitals/Pain On room air throughout.  96/63 in supine; 87/65 when sitting.  97% sp02 at beginning of session.   Mobility  Bed Mobility Overal bed mobility: +2 for physical assistance, max assist Bed Mobility: Sit to Supine;Supine to Sit Supine to sit: +2 for physical assistance, max assist Sit to supine: +2 for physical assistance, max assist General bed mobility comments: Can initiate movement with R LE, but L LE is flaccid.  Requires A at his legs at at his trunk to achieve upright sitting  Transfers Overall transfer level: Needs assistance Transfers: Sit to/from Stand Sit to Stand: +2 physical assistance, max assist General transfer comment: Unable to achieve sit to stand with +2, A provided at hips to lift and to block L LE from buckling.      PT Goals (current  goals can now be found in the care plan section) Acute Rehab PT Goals Patient Stated Goal: didn't state PT Goal Formulation: With patient Time For Goal Achievement: 12/30/13 Potential to Achieve Goals: Good  Visit Information  Last PT Received On: 12/20/13 Assistance Needed: +2 History of Present Illness: Pt from SNF admitted for acute respiratory distress and PNA..    Subjective Data  Patient Stated Goal: didn't state   Cognition  Cognition Arousal/Alertness: Awake/alert Behavior During Therapy: Restless Overall Cognitive Status: History of cognitive impairments - at baseline Memory: Decreased short-term memory;Decreased recall of precautions    Balance  Balance Overall balance assessment: Needs Assistance, Min guard Sitting-balance support: Intermittent Bilateral upper extremity supported (Increased time and effort required as he performs reaching tasks.  He is able to maintain for 15 minutes without need to rest.  As he fatigues, he goes down onto his L elbow.   End of Session PT - End of Session Equipment Utilized During Treatment: Gait belt Activity Tolerance: Patient limited by fatigue (limited by weakness) Patient left: in bed;with call bell/phone within reach;with bed alarm set Nurse Communication: Mobility status   GP    Cordelia Poche, SPT Pager:  [phone]  Cordelia Poche 12/20/2013, 4:12 PM

## 2013-12-20 NOTE — Discharge Summary (Signed)
Physician Discharge Summary  Patient ID: Francis Dodson MRN: [id number] DOB/AGE: [birth year] 78 y.o.  Admit date: 12/13/2013 Discharge date: 12/20/2013    Discharge Diagnoses:  Acute Respiratory Failure HCAP (Pseudomonas) Septic Shock Atrial Fibrillation  HTN HLD Chronic Diastolic Dysfunction Acute Kidney Injury Hypokalemia Metabolic Acidosis Hypernatremia Hypervolemia Hx of Prostate Cancer Severe Protein Calorie Malnutrition Anemia Diabetes II Hypoglycemia  Acute Encephalopathy Agitated Delirium Mild Dementia                                                                       DISCHARGE PLAN BY DIAGNOSIS     Acute Respiratory Failure HCAP (Pseudomonas) Bilateral Small Pleural Effusions   Discharge Plan: -recommend cxr in 1 week to ensure resolution of airspace disease -PRN albuterol neb for increased SOB or wheezing  -mobilize as able, PT   Septic Shock Atrial Fibrillation  HTN HLD Chronic Diastolic Dysfunction  Discharge Plan: -hold scheduled home lasix dosing post d/c due to normal but soft BP's.  Pt was on 40 mg BID prior to admit.  -PRN lasix for noted wt gain of > 3 lbs in one day, evidence of swelling or > 5lb gain in 1 wk -no further coumadin -continue Eliquis  -rate control with beta blocker, no further amiodarone -follow up with Cardiology post discharge in 2 wks - 1 month  Acute Kidney Injury Hypokalemia Metabolic Acidosis Hypernatremia Hypervolemia Hx of Prostate Cancer  Discharge Plan: -PRN monitoring of BMP / renal fxn -hold scheduled lasix as above  Severe Protein Calorie Malnutrition  Discharge Plan: -Dysphagia 3 Diet, thin liquids -continue feeding supplement, protein supplement -recommend Speech Pathology & Nutrition see at SNF  Anemia  Discharge Plan: -monitor CBC PRN, rec no transfusion unless <7%, active bleeding or MI <8%  Diabetes II Hypoglycemia   Discharge Plan: -Sensitive sliding scale  -monitor CBG's  ACHS  Acute Encephalopathy Agitated Delirium Mild Dementia  Discharge Plan: -continue risperdal, remeron  -monitor for oversedation  -PRN xanax                  DISCHARGE SUMMARY   Garfield Coiner Kakar is a 80 y.o. y/o male with a PMH of DM, PE (post ortho procedure), HTN, Polio (left side weakness), HTN, Diastolic CHF, AFib who presented to [facility] with difficulty breathing, and found to have PNA. Reported to have decreased appetite, loose stools, and fatigue in SNF.  Intubated, and transferred to [facility] for further treatment.  ICU course complicated by hypotension requiring phenylephrine, Afib with RVR and agitation. Sputum cultures positive for pseudomonas.  Patient was treated with IV antibiotics as below and has completed regimen.  Patient was treated with haldol for agitation.  Cardiology consulted for evaluation of Afib.  Patient was transitioned to PO amiodarone for afib.  He was taken off coumadin and placed on eliquis for Afib.  He remained intubated approx 24 hours.  Patient self extubated and pulled central lines out x2.  Medications for delirium adjusted and patient transferred to medical floor.  Noted episodes of mild hypoglycemia and SSI insulin adjusted for sensitive scale.  He continued to make slow improvement and is cleared for discharge with recommendations as above.     SIGNIFICANT EVENTS:  1/02 - Transfer to [facility] from [facility]  1/04 - A fib, low BP >> amiodarone, phenylephrine; agitated >> haldol  1/06 - Cardiology consulted, transition to po amiodarone, add eliquis  1/09 - on floor, decreased agitation   LINES / TUBES:  ETT 1/02 >>1/2  Lt IJ CVL 1/02 >>1/3 (pt pulled)  R IJ CVC 1/4 >> 1/6 (pt pulled)   CULTURES:  Blood 1/02 ([place]) >> NEG (called 1/8)  Sputum 1/02 >> Pseudomonas  Influenza PCR 1/02 >>negative  Pneumococcal Ag 1/02 >>negative  Legionella Ag 1/02 >> negative  C diff 1/03 >> negative   ANTIBIOTICS:  Vancomycin 1/02 >> 1/5  Aztreonam 1/02  >> 1/7  Levaquin 1/02 >>  Tamiflu 1/02 >> 1/3    Discharge Exam: General: No distress  Neuro: sleeping, arouses & slightly confused at times but follows commands and answers some questions appropriately, gen weakness, worse LLE (post polio)  HEENT: Pupils reactive, edentulous  Cardiovascular: irregular  Lungs: resps even non labored, diminished L, R clear  Abdomen: Soft, non tender  Musculoskeletal: No edema  Skin: No rashes   Filed Vitals:   12/19/13 0456 12/19/13 1334 12/19/13 2139 12/20/13 0620  BP: 102/71 96/57 102/68   Pulse: 94 73 81   Temp: 97.6 F (36.4 C) 97.7 F (36.5 C) 97.7 F (36.5 C)   TempSrc: Oral Oral Oral   Resp: $Remo'18 16 18   'MZqdn$ Height:      Weight: 142 lb 6.7 oz (64.6 kg)   144 lb 0.1 oz (65.321 kg)  SpO2: 97% 100% 100%      Discharge Labs  BMET  Recent Labs Lab 12/16/13 0535 12/17/13 0415 12/18/13 0240 12/19/13 0540 12/20/13 0455  NA 136* 132* 135* 143 138  K 2.6* 3.6* 3.5* 4.8 4.9  CL 104 98 106 112 108  CO2 18* 18* 17* 16* 16*  GLUCOSE 102* 75 83 66* 80  BUN $Re'14 14 14 12 9  'riX$ CREATININE 0.87 0.95 0.91 0.88 0.77  CALCIUM 7.1* 7.4* 7.7* 7.8* 7.3*  MG  --  1.8  --  2.0  --     CBC  Recent Labs Lab 12/17/13 0415 12/18/13 0240 12/20/13 0455  HGB 10.7* 10.4* 9.7*  HCT 33.4* 33.2* 30.3*  WBC 9.5 7.7 10.8*  PLT 336 286 288    Anti-Coagulation  Recent Labs Lab 12/15/13 0400 12/16/13 0535 12/17/13 0415 12/18/13 0240  INR 3.03* 2.54* 2.45* 3.20*      Medication List    STOP taking these medications       potassium chloride SA 20 MEQ tablet  Commonly known as:  K-DUR,KLOR-CON     warfarin 1 MG tablet  Commonly known as:  COUMADIN      TAKE these medications       albuterol (2.5 MG/3ML) 0.083% nebulizer solution  Commonly known as:  PROVENTIL  Take 3 mLs (2.5 mg total) by nebulization every 4 (four) hours as needed for wheezing or shortness of breath.     ALPRAZolam 0.25 MG tablet  Commonly known as:  XANAX  Take 0.25  mg by mouth at bedtime as needed for anxiety.     apixaban 5 MG Tabs tablet  Commonly known as:  ELIQUIS  Take 1 tablet (5 mg total) by mouth 2 (two) times daily.     atenolol 50 MG tablet  Commonly known as:  TENORMIN  Take 1 tablet (50 mg total) by mouth 2 (two) times daily.     esomeprazole 40 MG capsule  Commonly known as:  NEXIUM  Take 40 mg  by mouth daily at 12 noon.     furosemide 40 MG tablet  Commonly known as:  LASIX  Take 1 tablet (40 mg total) by mouth daily as needed (wt gain of > 3 lbs in one day, evidence of swelling or > 5lb gain in 1 wk).     insulin aspart 100 UNIT/ML injection  Commonly known as:  novoLOG  - Inject 0-9 Units into the skin 3 (three) times daily with meals. CBG < 70: implement hypoglycemia protocol  - CBG 70 - 120: 0 units  - CBG 121 - 150: 1 unit  - CBG 151 - 200: 2 units  - CBG 201 - 250: 3 units  - CBG 251 - 300: 5 units  - CBG 301 - 350: 7 units  - CBG 351 - 400: 9 units  - CBG > 400: call MD and obtain STAT lab verification     mirtazapine 15 MG tablet  Commonly known as:  REMERON  Take 15 mg by mouth at bedtime.     PROMOD PO  Take 30 mLs by mouth 2 (two) times daily.     feeding supplement (ENSURE COMPLETE) Liqd  Take 237 mLs by mouth 2 (two) times daily between meals.     risperiDONE 1 MG tablet  Commonly known as:  RISPERDAL  Take 1 tablet (1 mg total) by mouth 2 (two) times daily.        Follow-up Information   Schedule an appointment as soon as possible for a visit with [facility]). (To be seen post discharge)    Specialty:  Cardiology   Contact information:   [address] [place] [address] [phone]        Disposition: SNF for further rehab efforts.   Discharged Condition: Wilberto Console Dalsanto has met maximum benefit of inpatient care and is medically stable and cleared for discharge.  Patient is pending follow up as above.      Time spent on disposition:   Greater than 35 minutes.   Signed: Noe Gens, NP-C [facility] Pgr: [phone] Office: [phone]   I agree with this note.  Mariel Sleet Beeper  [phone]  Cell  [phone]  If no response or cell goes to voicemail, call beeper [phone]

## 2013-12-20 NOTE — Progress Notes (Signed)
Read, reviewed, edited and agree with student's findings and recommendations.  Gaia Gullikson B. Haydin Dunn, PT, DPT #[phone]  

## 2013-12-20 NOTE — Progress Notes (Signed)
[place] [facility]    Name: Francis Dodson MRN: [id number] DOB: 10/29/1930    ADMISSION DATE:  12/13/2013  REFERRING MD :  Lovie Macadamia  CHIEF COMPLAINT:  Short of breath  BRIEF PATIENT DESCRIPTION:  80 y/o male from SNF went to [facility] ER with difficulty breathing, and found to have PNA.  Intubated, and transferred to [facility] for further treatment.  SIGNIFICANT EVENTS: 1/02 - Transfer to [facility] from [facility] 1/04 - A fib, low BP >> amiodarone, phenylephrine; agitated >> haldol 1/06 - Cardiology consulted, transition to po amiodarone, add eliquis 1/09 - on floor, decreased agitation  STUDIES:   LINES / TUBES: ETT 1/02 >>1/2 Lt IJ CVL 1/02 >>1/3 (pt pulled) R IJ CVC 1/4 >> 1/6 (pt pulled)  CULTURES: Blood 1/02 ([place]) >> NEG (called 1/8)  Sputum 1/02 >> Pseudomonas Influenza PCR 1/02 >>negative Pneumococcal Ag 1/02 >>negative Legionella Ag 1/02 >> negative C diff 1/03 >> negative  ANTIBIOTICS: Vancomycin 1/02 >> 1/5 Aztreonam 1/02 >> 1/7 Levaquin 1/02 >> Tamiflu 1/02 >> 1/3  SUBJECTIVE: Sleepy, no acute events.   VITAL SIGNS: Temp:  [97.7 F (36.5 C)] 97.7 F (36.5 C) (01/08 2139) Pulse Rate:  [73-81] 81 (01/08 2139) Resp:  [16-18] 18 (01/08 2139) BP: (96-102)/(57-68) 102/68 mmHg (01/08 2139) SpO2:  [100 %] 100 % (01/08 2139) Weight:  [144 lb 0.1 oz (65.321 kg)] 144 lb 0.1 oz (65.321 kg) (01/09 0620) Room air  INTAKE / OUTPUT: Intake/Output     01/08 0701 - 01/09 0700 01/09 0701 - 01/10 0700   P.O. 1077 360   I.V. (mL/kg)     IV Piggyback 150    Total Intake(mL/kg) 1227 (18.8) 360 (5.5)   Urine (mL/kg/hr) 575 (0.4)    Total Output 575     Net +652 +360        Urine Occurrence  1 x   Stool Occurrence 1 x      PHYSICAL EXAMINATION: General: No distress Neuro: sleeping, arouses & slightly confused at times but follows commands and answers some questions appropriately, gen weakness, worse LLE (post polio)  HEENT:  Pupils reactive, edentulous Cardiovascular:   irregular Lungs: resps even non labored, diminished L, R clear Abdomen:  Soft, non tender Musculoskeletal:  No edema Skin:  No rashes  LABS:  CBC  Recent Labs Lab 12/17/13 0415 12/18/13 0240 12/20/13 0455  WBC 9.5 7.7 10.8*  HGB 10.7* 10.4* 9.7*  HCT 33.4* 33.2* 30.3*  PLT 336 286 288   Coag's  Recent Labs Lab 12/16/13 0535 12/17/13 0415 12/18/13 0240  INR 2.54* 2.45* 3.20*   BMET  Recent Labs Lab 12/18/13 0240 12/19/13 0540 12/20/13 0455  NA 135* 143 138  K 3.5* 4.8 4.9  CL 106 112 108  CO2 17* 16* 16*  BUN 14 12 9   CREATININE 0.91 0.88 0.77  GLUCOSE 83 66* 80   Glucose  Recent Labs Lab 12/19/13 0754 12/19/13 1148 12/19/13 1609 12/19/13 1631 12/19/13 2107 12/20/13 0728  GLUCAP 155* 135* 46* 91 111* 88    Imaging Dg Chest Portable 1 View  12/20/2013   CLINICAL DATA:  Pneumonia  EXAM: PORTABLE CHEST - 1 VIEW  COMPARISON:  DG CHEST 1V PORT dated 12/18/2013; DG CHEST 1V PORT dated 12/15/2013; DG CHEST 1V PORT dated 12/14/2013; DG CHEST 1V PORT dated 12/13/2013; DG CHEST 1V PORT SAME DAY dated 12/13/2013; DG CHEST 1V PORT dated 12/13/2013  FINDINGS: There are bilateral small pleural effusions. There is bibasilar airspace disease. There is no pneumothorax. Stable cardiomegaly. There  are surgical clips in the epigastric region.  The osseous structures are unremarkable.  IMPRESSION: Bilateral small pleural effusions and basilar airspace disease which may reflect atelectasis versus multi lobar pneumonia. No significant interval change compared with 12/18/2013.   Electronically Signed   By: Kathreen Devoid   On: 12/20/2013 08:02     ASSESSMENT / PLAN:  PULMONARY A: Acute respiratory failure 2nd to PNA. - much improved P:   -f/u CXR intermittently -oxygen as needed to keep SpO2 > 92% -BD's prn  CARDIOVASCULAR A:  Hypotension, Sepsis; Resolved.  Hx of A fib chronic coumadin >> family concerned about difficulty regulating coumadin as outpt; transitioned to eliquis  1/6. Hx of HTN, hyperlipidemia, chronic diastolic dysfx. P:  -continue amiodarone, tenormin per cardiology -continue eliquis in place of coumadin -gentle diuresis per cards   RENAL A:   Acute kidney injury >> resolved. Hypokalemia >> improved. Acidosis. Hx of prostate cancer. Hypernatremia >> resolved. Hypervolemia. P:   Lasix  GASTROINTESTINAL A:   Severe protein calorie malnutrition. P:   -D3 diet -protonix for SUP  HEMATOLOGIC A:   Anemia. P:    INFECTIOUS A:   PNA in SNF resident >> Pseudomonas in sputum.  Allergy to PCN. P:   - total 7 days abx, d/c levaquin - f/u CBC   ENDOCRINE A:   Hx of DM type II.   P:   -SSI  NEUROLOGIC A:   Acute encephalopathy with agitated delirium. Hx of mild dementia. P:   -added risperdal 1/6 >> continue for now -continue remeron -prn xanax    Dc to SNF today The son agrees to DNR  Mariel Sleet Beeper  [phone]  Cell  [phone]  If no response or cell goes to voicemail, call beeper [phone]

## 2013-12-20 NOTE — Progress Notes (Signed)
Occupational Therapy Evaluation Patient Details Name: Francis Dodson MRN: [id number] DOB: [birth year] Today's Date: 12/20/2013 Time: 5366-4403 OT Time Calculation (min): 18 min  OT Assessment / Plan / Recommendation History of present illness Pt from SNF admitted for acute respiratory distress.   Clinical Impression   Pt requires assist with all ADL and mobility. Assisted CNA with care. No skilled Ot needs at this time. Will defer further needs to SNF. OT signing off.    OT Assessment  All further OT needs can be met in the next venue of care    Follow Up Recommendations  SNF    Barriers to Discharge      Equipment Recommendations       Recommendations for Other Services    Frequency    eval only   Precautions / Restrictions Precautions Precautions: Fall; fragile skin Precaution Comments: Pt with L LE shorter than other, (aarent L hip surgery)   Pertinent Vitals/Pain no apparent distress Pt keeps taking off tele box     ADL  ADL Comments: requires assistance with bathing/dressing/ Able to self feed and complete grooming with set up/supervision    OT Diagnosis: Generalized weakness  OT Problem List: Decreased strength;Decreased activity tolerance OT Treatment Interventions:     OT Goals(Current goals can be found in the care plan section) Acute Rehab OT Goals Patient Stated Goal: didn't state  Visit Information  Last OT Received On: 12/20/13 Assistance Needed: +2 History of Present Illness: Pt from SNF admitted for acute respiratory distress.       Prior Cyril expects to be discharged to:: Skilled nursing facility Prior Function Level of Independence: Needs assistance Gait / Transfers Assistance Needed: beginning to walk with PT at facility otherwise uses w/c as primary mode of mobility ADL's / Homemaking Assistance Needed: assist from staff Comments: assist with all ADL. Able to feed self Communication Communication:  No difficulties         Vision/Perception     Cognition  Cognition Arousal/Alertness: Awake/alert Behavior During Therapy: Restless Overall Cognitive Status: History of cognitive impairments - at baseline    Extremity/Trunk Assessment Upper Extremity Assessment Upper Extremity Assessment: Generalized weakness Lower Extremity Assessment Lower Extremity Assessment: Generalized weakness Cervical / Trunk Assessment Cervical / Trunk Assessment: Kyphotic     Mobility Bed Mobility Overal bed mobility: Modified Independent (rolling around in bed) Transfers Overall transfer level: Needs assistance General transfer comment: needs +2. not assessed.      Exercise     Balance     End of Session OT - End of Session Activity Tolerance: Patient tolerated treatment well Patient left: in bed;with call bell/phone within reach;with bed alarm set;with nursing/sitter in room Nurse Communication: Mobility status  GO     [name],[name] 12/20/2013, 9:18 AM [name], OTR/L  [phone] 12/20/2013

## 2013-12-20 NOTE — Progress Notes (Signed)
CBGs on 1/8: 155-135-46/91-111 mg/dl      1/9: 88 mg/dl Recommend decreasing Novolog correction scale to SENSITIVE TID if CBGs continue to be less than 70 mg/dl.  Will continue to follow while in hospital.  Harvel Ricks RN BSN CDE

## 2013-12-27 ENCOUNTER — Encounter: Payer: Medicare Other | Admitting: Cardiology

## 2014-01-03 ENCOUNTER — Ambulatory Visit (INDEPENDENT_AMBULATORY_CARE_PROVIDER_SITE_OTHER): Payer: Medicare Other | Admitting: Cardiovascular Disease

## 2014-01-03 VITALS — BP 99/62 | HR 60

## 2014-01-03 DIAGNOSIS — I5032 Chronic diastolic (congestive) heart failure: Secondary | ICD-10-CM

## 2014-01-03 DIAGNOSIS — I10 Essential (primary) hypertension: Secondary | ICD-10-CM

## 2014-01-03 DIAGNOSIS — I4891 Unspecified atrial fibrillation: Secondary | ICD-10-CM

## 2014-01-03 DIAGNOSIS — Z7901 Long term (current) use of anticoagulants: Secondary | ICD-10-CM

## 2014-01-03 MED ORDER — APIXABAN 2.5 MG PO TABS: 2.5000 mg | ORAL_TABLET | Freq: Two times a day (BID) | ORAL | Status: AC

## 2014-01-03 NOTE — Progress Notes (Signed)
Patient ID: Francis Dodson, male   DOB: [birth year], 80 y.o.   MRN: [id number]      SUBJECTIVE: The patient is here for post-hospitalization followup. He has a history of atrial fibrillation, chronic diastolic heart failure, recurrent pulmonary embolism, polio, hyperlipidemia, prostate cancer, and hypertension. His most recent echocardiogram from September 2014 showed normal left ventricular systolic function with an ejection fraction of 65-70%. He was recently hospitalized for acute respiratory failure and septic shock due to pneumonia. He had been on daily Lasix but do to low blood pressures, it has been prescribed on a prn basis for weight gain. He is here with his son. He resides at a longterm care facility. He denies chest pain and shortness of breath. His son says that he is confused and less alert unless he uses oxygen at night.    Allergies  Allergen Reactions  . Aspirin   . Nabumetone   . Penicillins   . Piroxicam   . Salicylates     Current Outpatient Prescriptions  Medication Sig Dispense Refill  . albuterol (PROVENTIL) (2.5 MG/3ML) 0.083% nebulizer solution Take 3 mLs (2.5 mg total) by nebulization every 4 (four) hours as needed for wheezing or shortness of breath.  75 mL  12  . ALPRAZolam (XANAX) 0.25 MG tablet Take 0.25 mg by mouth at bedtime as needed for anxiety.      Marland Kitchen apixaban (ELIQUIS) 5 MG TABS tablet Take 1 tablet (5 mg total) by mouth 2 (two) times daily.  60 tablet  3  . atenolol (TENORMIN) 50 MG tablet Take 1 tablet (50 mg total) by mouth 2 (two) times daily.      . diphenoxylate-atropine (LOMOTIL) 2.5-0.025 MG per tablet Take 1 tablet by mouth 4 (four) times daily as needed for diarrhea or loose stools.      Marland Kitchen esomeprazole (NEXIUM) 40 MG capsule Take 40 mg by mouth daily at 12 noon.      . furosemide (LASIX) 40 MG tablet Take 1 tablet (40 mg total) by mouth daily as needed (wt gain of > 3 lbs in one day, evidence of swelling or > 5lb gain in 1 wk).  30 tablet  0    . mirtazapine (REMERON) 15 MG tablet Take 15 mg by mouth at bedtime.      . Nutritional Supplements (PROMOD PO) Take 30 mLs by mouth 2 (two) times daily.      . promethazine (PHENERGAN) 25 MG tablet Take 25 mg by mouth every 6 (six) hours as needed for nausea or vomiting.      . risperiDONE (RISPERDAL) 1 MG tablet Take 1 tablet (1 mg total) by mouth 2 (two) times daily.  30 tablet  0   No current facility-administered medications for this visit.    Past Medical History  Diagnosis Date  . Atrial fibrillation   . Diabetes mellitus   . Pulmonary embolism 2006    After orthopedic procedure  . Hypertension   . Prostate cancer   . Polio Age 85    Left side weakness  . Hyperlipidemia   . Diastolic heart failure     Past Surgical History  Procedure Laterality Date  . Hip fracture surgery      History   Social History  . Marital Status: Widowed    Spouse Name: N/A    Number of Children: N/A  . Years of Education: N/A   Occupational History  . Not on file.   Social History Main Topics  . Smoking status:  Former Smoker    Quit date: 11/24/2013  . Smokeless tobacco: Not on file  . Alcohol Use: Not on file  . Drug Use: Not on file  . Sexual Activity: Not on file   Other Topics Concern  . Not on file   Social History Narrative  . No narrative on file     Filed Vitals:   01/03/14 1302  BP: 99/62  Pulse: 60    PHYSICAL EXAM General: NAD Neck: No JVD, no thyromegaly or thyroid nodule.  Lungs: Clear to auscultation bilaterally with normal respiratory effort. CV: Nondisplaced PMI.  Distant heart sounds but irregular rhythm which is rate controlled, normal S1/S2, no S3/S4, no murmur.  No peripheral edema.  No carotid bruit.  Normal pedal pulses.  Abdomen: Soft, nontender, no hepatosplenomegaly, no distention.  Neurologic: Alert and oriented x 3.  Psych: Normal affect. Extremities: No clubbing or cyanosis.   ECG: reviewed and available in electronic  records.      ASSESSMENT AND PLAN: 1. Atrial fibrillation: currently rate controlled. Continue atenolol 50 mg bid. Given his age, will reduce Eliquis to 2.5 mg bid. 2. Chronic diastolic heart failure: euvolemic. Continue Lasix on a prn basis for weight gain. 3. HTN: actually mildly hypotensive. No medication changes at present. Will monitor.  Dispo: f/u 3 months.   Kate Sable, M.D., F.A.C.C.

## 2014-01-03 NOTE — Patient Instructions (Signed)
   Decrease Eliquis to 2.5mg  twice a day   Continue all other medications.   Follow up in  3 months

## 2014-01-12 DEATH — deceased

## 2014-01-27 ENCOUNTER — Encounter: Payer: Medicare Other | Admitting: Cardiology

## 2015-04-10 IMAGING — CR DG ABD PORTABLE 1V
1 series · 1 of 1 positions shown · non-contrast
Comparison: None.

CLINICAL DATA: OG tube placement.

EXAM:
PORTABLE ABDOMEN - 1 VIEW

[AP]
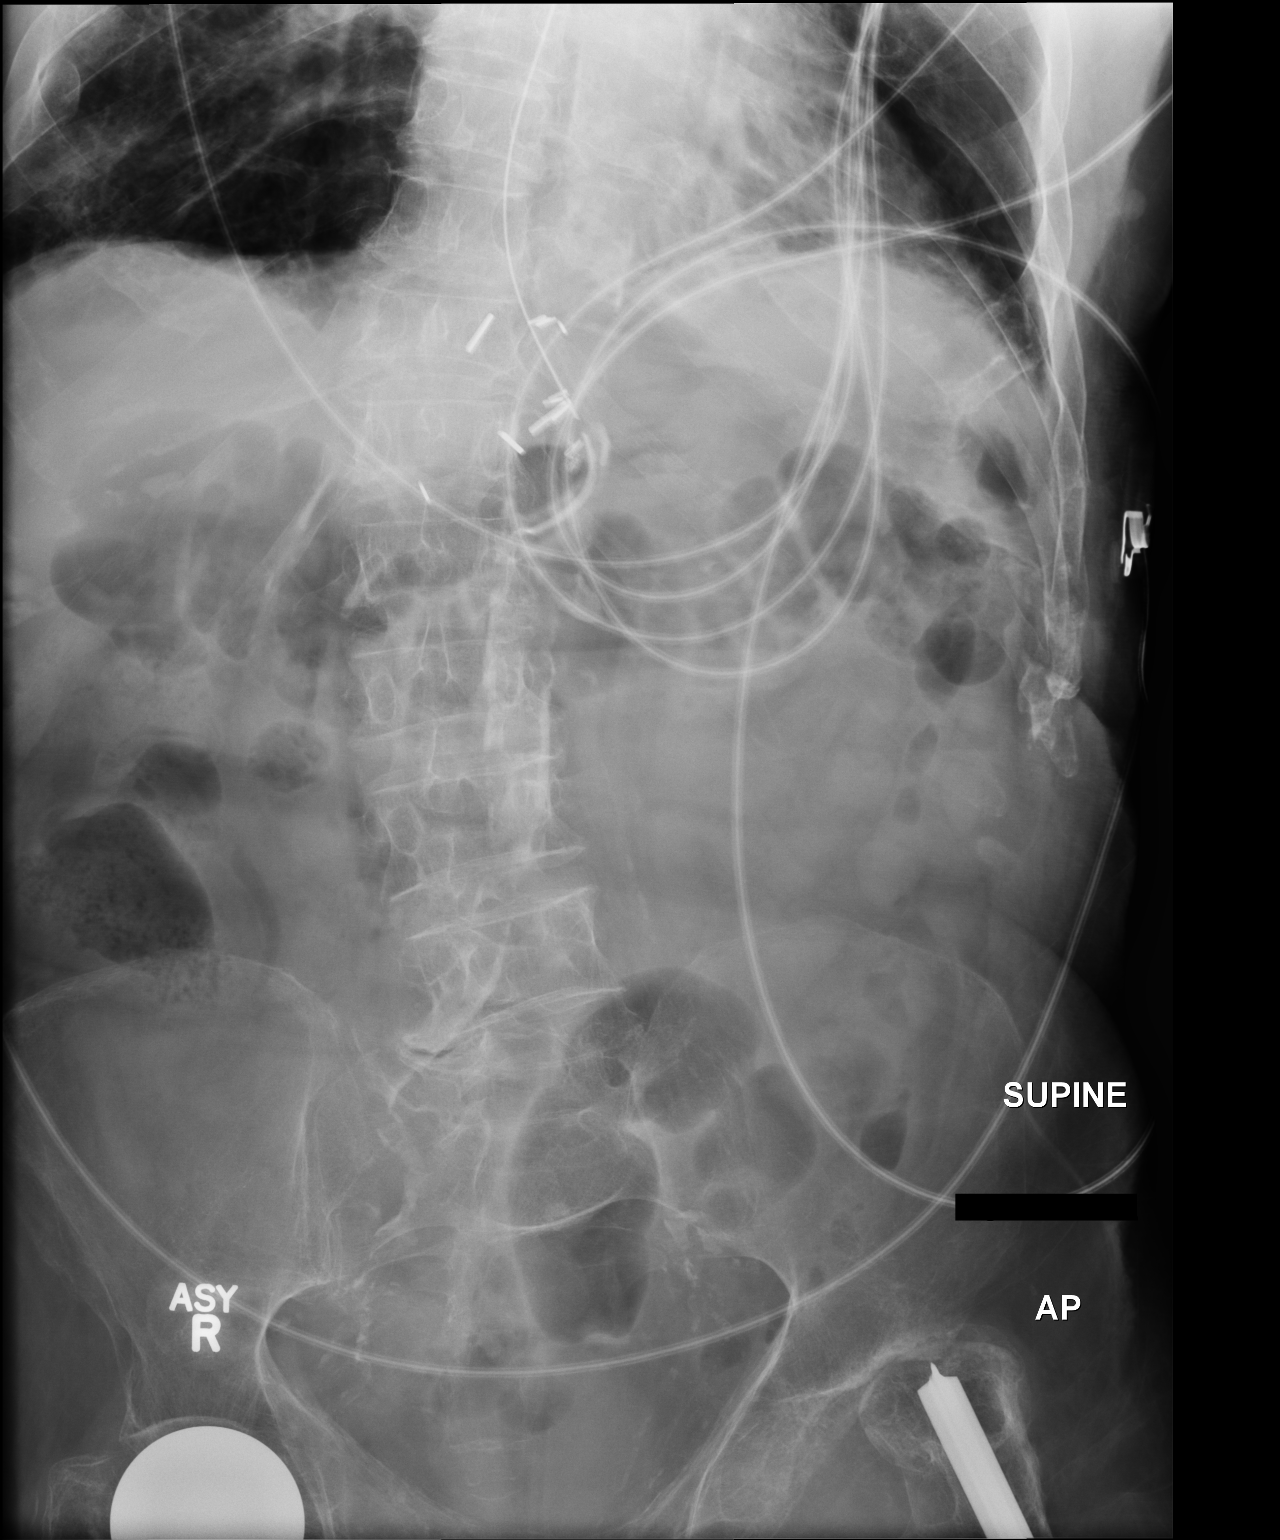

[1 of 1 positions shown; findings below may reference images not displayed]

FINDINGS: NG tube tip is in the proximal to mid stomach. The side port is near
the GE junction.

Nonobstructive bowel gas pattern. No supine evidence of free air.
Surgical clips in the upper abdomen near the GE junction.
IMPRESSION: NG tube tip in the proximal to mid stomach.
